# Patient Record
Sex: Female | Born: 1970 | Race: White | Hispanic: No | Marital: Married | Smoking: Former smoker
Health system: Southern US, Community
[De-identification: ages and names within clinical notes are randomized; demographics above are authoritative.]

## PROBLEM LIST (undated history)

## (undated) DIAGNOSIS — Z8709 Personal history of other diseases of the respiratory system: Secondary | ICD-10-CM

## (undated) DIAGNOSIS — R197 Diarrhea, unspecified: Secondary | ICD-10-CM

## (undated) DIAGNOSIS — Q796 Ehlers-Danlos syndrome, unspecified: Secondary | ICD-10-CM

## (undated) DIAGNOSIS — M2550 Pain in unspecified joint: Secondary | ICD-10-CM

## (undated) DIAGNOSIS — K649 Unspecified hemorrhoids: Secondary | ICD-10-CM

## (undated) DIAGNOSIS — Z8489 Family history of other specified conditions: Secondary | ICD-10-CM

## (undated) DIAGNOSIS — J302 Other seasonal allergic rhinitis: Secondary | ICD-10-CM

## (undated) DIAGNOSIS — Z9889 Other specified postprocedural states: Secondary | ICD-10-CM

## (undated) DIAGNOSIS — Z8669 Personal history of other diseases of the nervous system and sense organs: Secondary | ICD-10-CM

## (undated) DIAGNOSIS — R112 Nausea with vomiting, unspecified: Secondary | ICD-10-CM

## (undated) DIAGNOSIS — M1990 Unspecified osteoarthritis, unspecified site: Secondary | ICD-10-CM

## (undated) DIAGNOSIS — K5909 Other constipation: Secondary | ICD-10-CM

## (undated) DIAGNOSIS — J189 Pneumonia, unspecified organism: Secondary | ICD-10-CM

## (undated) DIAGNOSIS — K219 Gastro-esophageal reflux disease without esophagitis: Secondary | ICD-10-CM

## (undated) DIAGNOSIS — K429 Umbilical hernia without obstruction or gangrene: Secondary | ICD-10-CM

## (undated) HISTORY — PX: KNEE SURGERY: SHX244

## (undated) HISTORY — PX: TUBAL LIGATION: SHX77

---

## 2000-07-09 DIAGNOSIS — J189 Pneumonia, unspecified organism: Secondary | ICD-10-CM

## 2000-07-09 HISTORY — DX: Pneumonia, unspecified organism: J18.9

## 2008-04-05 ENCOUNTER — Emergency Department (HOSPITAL_COMMUNITY): Admission: EM | Admit: 2008-04-05 | Discharge: 2008-04-05 | Payer: Self-pay | Admitting: Family Medicine

## 2009-02-05 ENCOUNTER — Ambulatory Visit: Payer: Self-pay | Admitting: Diagnostic Radiology

## 2009-02-05 ENCOUNTER — Emergency Department (HOSPITAL_BASED_OUTPATIENT_CLINIC_OR_DEPARTMENT_OTHER): Admission: EM | Admit: 2009-02-05 | Discharge: 2009-02-05 | Payer: Self-pay | Admitting: Emergency Medicine

## 2009-03-10 ENCOUNTER — Encounter: Admission: RE | Admit: 2009-03-10 | Discharge: 2009-03-10 | Payer: Self-pay | Admitting: Orthopedic Surgery

## 2009-04-17 ENCOUNTER — Emergency Department (HOSPITAL_BASED_OUTPATIENT_CLINIC_OR_DEPARTMENT_OTHER): Admission: EM | Admit: 2009-04-17 | Discharge: 2009-04-17 | Payer: Self-pay | Admitting: Emergency Medicine

## 2009-04-17 ENCOUNTER — Ambulatory Visit: Payer: Self-pay | Admitting: Diagnostic Radiology

## 2011-04-09 LAB — POCT URINALYSIS DIP (DEVICE)
Hgb urine dipstick: NEGATIVE
Nitrite: NEGATIVE
Operator id: 235561
Specific Gravity, Urine: 1.01
pH: 7

## 2011-04-09 LAB — CBC
Hemoglobin: 14.7
RBC: 4.86
RDW: 12.9
WBC: 8.1

## 2011-04-09 LAB — POCT PREGNANCY, URINE: Preg Test, Ur: NEGATIVE

## 2011-04-09 LAB — DIFFERENTIAL
Basophils Absolute: 0.1
Lymphs Abs: 1.8
Monocytes Absolute: 0.6
Neutrophils Relative %: 68

## 2011-04-09 LAB — BASIC METABOLIC PANEL
BUN: 8
CO2: 29
Chloride: 104
GFR calc non Af Amer: >60
Glucose, Bld: 91

## 2011-04-09 LAB — WET PREP, GENITAL: Clue Cells Wet Prep HPF POC: NONE SEEN

## 2011-04-09 LAB — GC/CHLAMYDIA PROBE AMP, GENITAL: Chlamydia, DNA Probe: NEGATIVE

## 2011-07-05 ENCOUNTER — Emergency Department (HOSPITAL_COMMUNITY): Payer: Self-pay

## 2011-07-05 ENCOUNTER — Encounter: Payer: Self-pay | Admitting: *Deleted

## 2011-07-05 ENCOUNTER — Emergency Department (HOSPITAL_COMMUNITY)
Admission: EM | Admit: 2011-07-05 | Discharge: 2011-07-05 | Disposition: A | Discharge status: Home / Self Care | Payer: Self-pay | Attending: Emergency Medicine | Admitting: Emergency Medicine

## 2011-07-05 DIAGNOSIS — R0789 Other chest pain: Secondary | ICD-10-CM | POA: Insufficient documentation

## 2011-07-05 DIAGNOSIS — R05 Cough: Secondary | ICD-10-CM | POA: Insufficient documentation

## 2011-07-05 DIAGNOSIS — J40 Bronchitis, not specified as acute or chronic: Secondary | ICD-10-CM | POA: Insufficient documentation

## 2011-07-05 DIAGNOSIS — R059 Cough, unspecified: Secondary | ICD-10-CM | POA: Insufficient documentation

## 2011-07-05 MED ORDER — HYDROCODONE-ACETAMINOPHEN 5-500 MG PO TABS: 1.0000 | ORAL_TABLET | Freq: Four times a day (QID) | ORAL | Status: AC | PRN

## 2011-07-05 MED ORDER — IBUPROFEN 800 MG PO TABS
800.0000 mg | ORAL_TABLET | Freq: Once | ORAL | Status: AC
  Administered 2011-07-05: 800 mg via ORAL
  Filled 2011-07-05: qty 1

## 2011-07-05 MED ORDER — HYDROCODONE-ACETAMINOPHEN 5-325 MG PO TABS
2.0000 | ORAL_TABLET | Freq: Once | ORAL | Status: AC
  Administered 2011-07-05: 2 via ORAL
  Filled 2011-07-05: qty 2

## 2011-07-05 MED ORDER — AZITHROMYCIN 250 MG PO TABS: ORAL_TABLET | ORAL | Status: AC

## 2011-07-05 NOTE — ED Provider Notes (Signed)
History     CSN: [id number]  Arrival date & time 07/05/11  0908   First MD Initiated Contact with Patient 07/05/11 [phone]      No chief complaint on file.   (Consider location/radiation/quality/duration/timing/severity/associated sxs/prior treatment) The history is provided by the patient.  pt c/o prod cough x 1-2 weeks. Last pm coughing exceptionally hard and felt pop and increased pain to right lateral chest. Constant, sharp pain to area. Worse w movement, palpation, coughing. No sob. Also notes recent nasal congestion, scratchy throat. Says strep test at [facility]' negative. Non smoker. Sub fever. No chills/sweats. No trouble breathing or swallowing. No leg pain or swelling. No dvt or pe hx. No hx ptx.   History reviewed. No pertinent past medical history.  Past Surgical History  Procedure Date  . Cesarean section   . Knee arthroplasty 2nd surgerywas ACL replacement    History reviewed. No pertinent family history.  History  Substance Use Topics  . Smoking status: Passive Smoker  . Smokeless tobacco: Not on file  . Alcohol Use: Yes     socially    OB History    Grav Para Term Preterm Abortions TAB SAB Ect Mult Living                  Review of Systems  Constitutional: Negative for chills.  HENT: Negative for neck pain and neck stiffness.   Eyes: Negative for discharge and redness.  Respiratory: Negative for shortness of breath.   Cardiovascular: Negative for leg swelling.  Gastrointestinal: Negative for vomiting and diarrhea.  Genitourinary: Negative for flank pain.  Neurological: Negative for headaches.    Allergies  Penicillins  Home Medications   Current Outpatient Rx  Name Route Sig Dispense Refill  . GUAIFENESIN ER 600 MG PO TB12 Oral Take 600 mg by mouth 2 (two) times daily.      Marland Kitchen HYDROCODONE-HOMATROPINE 5-1.5 MG/5ML PO SYRP Oral Take 5 mLs by mouth every 6 (six) hours as needed. For cough     . IBUPROFEN 200 MG PO TABS Oral Take 800 mg by mouth  every 6 (six) hours as needed. For pain     . PSEUDOEPHEDRINE HCL 120 MG PO TB12 Oral Take 120 mg by mouth every 12 (twelve) hours.        BP 120/74  Pulse 71  Temp(Src) 98.5 F (36.9 C) (Oral)  Resp 20  SpO2 100%  LMP 06/23/2011  Physical Exam  Nursing note and vitals reviewed. Constitutional: She is oriented to person, place, and time. She appears well-developed and well-nourished. No distress.  HENT:  Nose: Nose normal.  Mouth/Throat: Oropharynx is clear and moist.  Eyes: Conjunctivae are normal. No scleral icterus.  Neck: Neck supple. No tracheal deviation present.  Cardiovascular: Normal rate, regular rhythm, normal heart sounds and intact distal pulses.  Exam reveals no gallop and no friction rub.   No murmur heard. Pulmonary/Chest: Effort normal and breath sounds normal. No respiratory distress. She exhibits tenderness.       Marked right chest tenderness reproducing symptoms  Abdominal: Soft. Normal appearance and bowel sounds are normal. She exhibits no distension and no mass. There is no tenderness. There is no rebound and no guarding.  Musculoskeletal: Normal range of motion. She exhibits no edema and no tenderness.       Spine nt  Lymphadenopathy:    She has no cervical adenopathy.  Neurological: She is alert and oriented to person, place, and time.  Skin: Skin is warm and dry.  No rash noted.  Psychiatric: She has a normal mood and affect.    ED Course  Procedures (including critical care time)  Dg Chest 2 View  07/05/2011  *RADIOLOGY REPORT*  Clinical Data: Chest pain, rule out rib fracture from coughing, rule out pneumonia  CHEST - 2 VIEW  Comparison: None.  Findings: Heart size and vascular pattern are normal.  No infiltrate or effusion.  No pneumothorax.  No rib lesions.  IMPRESSION: Normal study.  Original Report Authenticated By: [id number]   Dg Ribs Unilateral Right  07/05/2011  *RADIOLOGY REPORT*  Clinical Data: Right rib pain, chest pain  RIGHT RIBS - 2  VIEW  Comparison: 07/05/2011  Findings: Two views right ribs submitted.  No right rib fracture is identified.  No diagnostic pneumothorax.  IMPRESSION: No right rib fracture is identified.  No diagnostic pneumothorax.  Original Report Authenticated By: Natasha Mead, M.D.       MDM  Cxr. Pt has ride, didn't drive. No meds pta. vicodin po. Motrin po.   As worsening prod cough x 2weeks, will rx bronchitis.         Suzi Roots, MD 07/05/11 1020

## 2011-07-05 NOTE — ED Notes (Signed)
Patient states she has had a really bad cold and went to the doctor on Friday. Patient states that she was getting out of the shower last night and started coughing and felt something pop in her right side and the pain started then. Patient states she slept in a recliner with a heating pad and pillows last night thinking it would be better this morning but the pain is worse and the congestion and cough is worse.

## 2011-12-14 ENCOUNTER — Emergency Department (HOSPITAL_COMMUNITY)
Admission: EM | Admit: 2011-12-14 | Discharge: 2011-12-14 | Disposition: A | Discharge status: Home / Self Care | Payer: Self-pay | Attending: Emergency Medicine | Admitting: Emergency Medicine

## 2011-12-14 ENCOUNTER — Encounter (HOSPITAL_COMMUNITY): Payer: Self-pay | Admitting: *Deleted

## 2011-12-14 ENCOUNTER — Emergency Department (HOSPITAL_COMMUNITY): Payer: Self-pay

## 2011-12-14 DIAGNOSIS — K429 Umbilical hernia without obstruction or gangrene: Secondary | ICD-10-CM | POA: Insufficient documentation

## 2011-12-14 HISTORY — DX: Umbilical hernia without obstruction or gangrene: K42.9

## 2011-12-14 LAB — BASIC METABOLIC PANEL
BUN: 11 mg/dL (ref 6–23)
Calcium: 9.5 mg/dL (ref 8.4–10.5)
GFR calc Af Amer: >90 mL/min (ref >90)

## 2011-12-14 LAB — CBC
HCT: 44.5 % (ref 36.0–46.0)
MCH: 31.3 pg (ref 26.0–34.0)
MCHC: 34.2 g/dL (ref 30.0–36.0)
RBC: 4.86 MIL/uL (ref 3.87–5.11)
RDW: 12.8 % (ref 11.5–15.5)

## 2011-12-14 MED ORDER — ONDANSETRON HCL 4 MG/2ML IJ SOLN
4.0000 mg | Freq: Once | INTRAMUSCULAR | Status: AC
  Administered 2011-12-14: 4 mg via INTRAVENOUS
  Filled 2011-12-14: qty 2

## 2011-12-14 MED ORDER — HYDROMORPHONE HCL PF 1 MG/ML IJ SOLN
1.0000 mg | Freq: Once | INTRAMUSCULAR | Status: AC
  Administered 2011-12-14: 1 mg via INTRAVENOUS
  Filled 2011-12-14: qty 1

## 2011-12-14 MED ORDER — ONDANSETRON HCL 4 MG PO TABS: 4.0000 mg | ORAL_TABLET | Freq: Four times a day (QID) | ORAL | Status: AC

## 2011-12-14 MED ORDER — OXYCODONE-ACETAMINOPHEN 5-325 MG PO TABS: 1.0000 | ORAL_TABLET | Freq: Four times a day (QID) | ORAL | Status: AC | PRN

## 2011-12-14 NOTE — Discharge Instructions (Signed)
Hernia  A hernia occurs when an internal organ pushes out through a weak spot in the abdominal wall. Hernias most commonly occur in the groin and around the navel. Hernias often can be pushed back into place (reduced). Most hernias tend to get worse over time. Some abdominal hernias can get stuck in the opening (irreducible or incarcerated hernia) and cannot be reduced. An irreducible abdominal hernia which is tightly squeezed into the opening is at risk for impaired blood supply (strangulated hernia). A strangulated hernia is a medical emergency. Because of the risk for an irreducible or strangulated hernia, surgery may be recommended to repair a hernia.  CAUSES     Heavy lifting.    Prolonged coughing.    Straining to have a bowel movement.    A cut (incision) made during an abdominal surgery.   HOME CARE INSTRUCTIONS     Bed rest is not required. You may continue your normal activities.    Avoid lifting more than 10 pounds (4.5 kg) or straining.    Cough gently. If you are a smoker it is best to stop. Even the best hernia repair can break down with the continual strain of coughing. Even if you do not have your hernia repaired, a cough will continue to aggravate the problem.    Do not wear anything tight over your hernia. Do not try to keep it in with an outside bandage or truss. These can damage abdominal contents if they are trapped within the hernia sac.    Eat a normal diet.    Avoid constipation. Straining over long periods of time will increase hernia size and encourage breakdown of repairs. If you cannot do this with diet alone, stool softeners may be used.   SEEK IMMEDIATE MEDICAL CARE IF:     You have a fever.    You develop increasing abdominal pain.    You feel nauseous or vomit.    Your hernia is stuck outside the abdomen, looks discolored, feels hard, or is tender.    You have any changes in your bowel habits or in the hernia that are unusual for you.     You have increased pain or swelling around the hernia.    You cannot push the hernia back in place by applying gentle pressure while lying down.   MAKE SURE YOU:     Understand these instructions.    Will watch your condition.    Will get help right away if you are not doing well or get worse.   Document Released: 06/25/2005 Document Revised: 06/14/2011 Document Reviewed: 02/12/2008  [employer] Patient Information 2012 [employer]    Hernia, Surgical Repair  A hernia occurs when an internal organ pushes out through a weak spot in the belly (abdominal) wall muscles. Hernias commonly occur in the groin and around the navel. Hernias often can be pushed back into place (reduced). Most hernias tend to get worse over time. Problems occur when abdominal contents get stuck in the opening (incarcerated hernia). The blood supply gets cut off (strangulated hernia). This is an emergency and needs surgery. Otherwise, hernia repair can be an elective procedure. This means you can schedule this at your convenience when an emergency is not present. Because complications can occur, if you decide to repair the hernia, it is best to do it soon. When it becomes an emergency procedure, there is increased risk of complications after surgery.  CAUSES     Heavy lifting.    Obesity.      Prolonged coughing.    Straining to move your bowels.    Hernias can also occur through a cut (incision) by a surgeonafter an abdominal operation.   HOME CARE INSTRUCTIONS  Before the repair:   Bed rest is not required. You may continue your normal activities, but avoid heavy lifting (more than 10 pounds) or straining. Cough gently. If you are a smoker, it is best to stop. Even the best hernia repair can break down with the continual strain of coughing.    Do not wear anything tight over your hernia. Do not try to keep it in with an outside bandage or truss. These can damage abdominal contents if they are trapped in the hernia sac.     Eat a normal diet. Avoid constipation. Straining over long periods of time to have a bowel movement will increase hernia size. It also can breakdown repairs. If you cannot do this with diet alone, laxatives or stool softeners may be used.   PRIOR TO SURGERY, SEEK IMMEDIATE MEDICAL CARE IF:  You have problems (symptoms) of a trapped (incarcerated) hernia. Symptoms include:   An oral temperature above 102 F (38.9 C) develops, or as your caregiver suggests.    Increasing abdominal pain.    Feeling sick to your stomach(nausea) and vomiting.    You stop passing gas or stool.    The hernia is stuck outside the abdomen, looks discolored, feels hard, or is tender.    You have any changes in your bowel habits or in the hernia that is unusual for you.   LET YOUR CAREGIVERS KNOW ABOUT THE FOLLOWING:   Allergies.    Medications taken including herbs, eye drops, over the counter medications, and creams.    Use of steroids (by mouth or creams).    Family or personal history of problems with anesthetics or Novocaine.    Possibility of pregnancy, if this applies.    Personal history of blood clots (thrombophlebitis).    Family or personal history of bleeding or blood problems.    Previous surgery.    Other health problems.   BEFORE THE PROCEDURE  You should be present 1 hour prior to your procedure, or as directed by your caregiver.    AFTER THE PROCEDURE  After surgery, you will be taken to the recovery area. A nurse will watch and check your progress there. Once you are awake, stable, and taking fluids well, you will be allowed to go home as long as there are no problems. Once home, an ice pack (wrapped in a light towel) applied to your operative site may help with discomfort. It may also keep the swelling down. Do not lift anything heavier than 10 pounds (4.55 kilograms). Take showers not baths. Do not drive while taking narcotics. Follow instructions as suggested by your caregiver.     SEEK IMMEDIATE MEDICAL CARE IF:  After surgery:   There is redness, swelling, or increasing pain in the wound.    There is pus coming from the wound.    There is drainage from a wound lasting longer than 1 day.    An unexplained oral temperature above 102 F (38.9 C) develops.    You notice a foul smell coming from the wound or dressing.    There is a breaking open of a wound (edged not staying together) after the sutures have been removed.    You notice increasing pain in the shoulders (shoulder strap areas).    You develop dizzy episodes or fainting

## 2011-12-14 NOTE — ED Notes (Signed)
Patient states that she has an abdominal hernia that has been present for four years. States that it occurred after the birth of her fourth child.  Yesterday she began having extreme pain that caused her to double over.  States that the pain worsens when she eats and that she is belching more than usual. Area is tender to palpation. Bowel sounds present.

## 2011-12-14 NOTE — ED Notes (Signed)
Patient has hx of umbilical hernia,  She was lifting boxes last night and felt pain in her abd.  Patient states the pain increased last night.  Patient states she has pain that radiates into her back.   Patient states she feels like she is swollen in her abd and feels pressure like gas in her stomach which is unusual.  Patient has had intermittent nausea as well

## 2011-12-14 NOTE — ED Provider Notes (Signed)
History     CSN: [id number]  Arrival date & time 12/14/11  1014   First MD Initiated Contact with Patient 12/14/11 1214      Chief Complaint  Patient presents with  . Abdominal Pain    (Consider location/radiation/quality/duration/timing/severity/associated sxs/prior treatment) HPI  Patient presents to the ED with complaints of her hernia hurting. She states that she has a known umbilical hernia that became very painful last night. She states that she was doubled over in pain and pacing around. Then she admits to having one episode of vomiting this morning. After having vomiting her husband told her she should come to the ER because she is aware of the possibility of bowel obstruction or strangulated hernia. Therefore she came for pain management and to make sure "everything is okay". 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 Pt in NAD and VSS  Past Medical History  Diagnosis Date  . Umbilical hernia     Past Surgical History  Procedure Date  . Cesarean section   . Knee arthroplasty 2nd surgerywas ACL replacement    No family history on file.  History  Substance Use Topics  . Smoking status: Passive Smoker  . Smokeless tobacco: Not on file  . Alcohol Use: Yes     socially    OB History    Grav Para Term Preterm Abortions TAB SAB Ect Mult Living                  Review of Systems   HEENT: denies blurry vision or change in hearing PULMONARY: Denies difficulty breathing and SOB CARDIAC: denies chest pain or heart palpitations MUSCULOSKELETAL:  denies being unable to ambulate ABDOMEN AL: denies diarrhea GU: denies loss of bowel or urinary control NEURO: denies numbness and tingling in extremities SKIN: no new rashes PSYCH: patient behavior is normal NECK: Not complaining of neck pain   Allergies  Penicillins  Home Medications   Current Outpatient Rx  Name Route Sig  Dispense Refill  . GLUCOSAMINE CHONDR COMPLEX PO Oral Take 1 tablet by mouth daily.    . IBUPROFEN 200 MG PO TABS Oral Take 200 mg by mouth every 6 (six) hours as needed. For pain.    . ADULT MULTIVITAMIN W/MINERALS CH Oral Take 1 tablet by mouth daily.    Marland Kitchen ONDANSETRON HCL 4 MG PO TABS Oral Take 1 tablet (4 mg total) by mouth every 6 (six) hours. 12 tablet 0  . OXYCODONE-ACETAMINOPHEN 5-325 MG PO TABS Oral Take 1 tablet by mouth every 6 (six) hours as needed for pain. 15 tablet 0    BP 120/73  Pulse 58  Temp(Src) 98.9 F (37.2 C) (Oral)  Resp 16  Ht 5\' 5"  (1.651 m)  Wt 197 lb (89.359 kg)  BMI 32.78 kg/m2  SpO2 98%  LMP 11/30/2011  Physical Exam  Constitutional: She appears well-developed and well-nourished. No distress.  HENT:  Head: Normocephalic and atraumatic.  Mouth/Throat: Uvula is midline, oropharynx is clear and moist and mucous membranes are normal.  Eyes: Pupils are equal, round, and reactive to light.  Neck: Trachea normal, normal range of motion and full passive range of motion without pain.  Cardiovascular: Normal rate, regular rhythm, normal heart sounds and normal pulses.   Pulmonary/Chest: Effort normal and breath sounds normal. Chest wall is not dull to percussion. She exhibits no crepitus, no edema, no deformity and no retraction.  Abdominal: Normal appearance. She exhibits no distension. There is tenderness. There is no guarding.       Umbilical hernia noted. With firm palpation I was able to reduce the hernia without it popping back through again.  Musculoskeletal: Normal range of motion.  Neurological: She is alert. She has normal strength.  Skin: Skin is warm, dry and intact. She is not diaphoretic.  Psychiatric: She has a normal mood and affect. Her speech is normal. Cognition and memory are normal.    ED Course  Procedures (including critical care time)  Labs Reviewed  CBC - Abnormal; Notable for the following:    WBC 12.7 (*)    Hemoglobin 15.2 (*)     All other components within normal limits  BASIC METABOLIC PANEL   Dg Abd Acute W/chest  12/14/2011  *RADIOLOGY REPORT*  Clinical Data: Mid abdominal pain with nausea.  Question small bowel obstruction?  ACUTE ABDOMEN SERIES (ABDOMEN 2 VIEW & CHEST 1 VIEW)  Comparison: 07/05/2011 chest x-ray.  Findings: Clear lungs.  Heart size within normal limits.  No plain film evidence of bowel obstruction.  No free intraperitoneal air.  IMPRESSION: No evidence of bowel obstruction or free intraperitoneal air.  Original Report Authenticated By: Fuller Canada, M.D.     1. Umbilical hernia       MDM  Patient initially had severe pain with hernia reduction, after about 10 minutes she had significant relief. Her plan films show no concerns for perforation or obstruction. She is feeling much better. She has been PO challenged without any complications.  I have thoroughly discussed reasons to return to the ED. Strangulated hernia being my biggest concern. She voices her understanding.  I have given her a referral to [facility] for elective surgery.  Pt has been advised of the symptoms that warrant their return to the ED. Patient has voiced understanding and has agreed to follow-up with the PCP or specialist.         Dorthula Matas, PA 12/14/11 [phone]

## 2011-12-14 NOTE — ED Provider Notes (Signed)
Medical screening examination/treatment/procedure(s) were performed by non-physician practitioner and as supervising physician I was immediately available for consultation/collaboration.   Dione Booze, MD 12/14/11 [phone]

## 2012-05-14 ENCOUNTER — Institutional Professional Consult (permissible substitution): Payer: Self-pay | Admitting: Internal Medicine

## 2013-10-04 ENCOUNTER — Emergency Department (HOSPITAL_COMMUNITY)
Admission: EM | Admit: 2013-10-04 | Discharge: 2013-10-04 | Disposition: A | Discharge status: Home / Self Care | Payer: BC Managed Care – PPO | Attending: Emergency Medicine | Admitting: Emergency Medicine

## 2013-10-04 ENCOUNTER — Emergency Department (HOSPITAL_COMMUNITY): Payer: BC Managed Care – PPO

## 2013-10-04 ENCOUNTER — Encounter (HOSPITAL_COMMUNITY): Payer: Self-pay | Admitting: Emergency Medicine

## 2013-10-04 DIAGNOSIS — Z79899 Other long term (current) drug therapy: Secondary | ICD-10-CM | POA: Insufficient documentation

## 2013-10-04 DIAGNOSIS — K429 Umbilical hernia without obstruction or gangrene: Secondary | ICD-10-CM | POA: Insufficient documentation

## 2013-10-04 DIAGNOSIS — R1033 Periumbilical pain: Secondary | ICD-10-CM | POA: Insufficient documentation

## 2013-10-04 DIAGNOSIS — Z880 Allergy status to penicillin: Secondary | ICD-10-CM | POA: Insufficient documentation

## 2013-10-04 DIAGNOSIS — Y939 Activity, unspecified: Secondary | ICD-10-CM | POA: Insufficient documentation

## 2013-10-04 DIAGNOSIS — Y929 Unspecified place or not applicable: Secondary | ICD-10-CM | POA: Insufficient documentation

## 2013-10-04 DIAGNOSIS — T59811A Toxic effect of smoke, accidental (unintentional), initial encounter: Secondary | ICD-10-CM | POA: Insufficient documentation

## 2013-10-04 LAB — COMPREHENSIVE METABOLIC PANEL
ALBUMIN: 3.6 g/dL (ref 3.5–5.2)
ALT: 17 U/L (ref 0–35)
AST: 15 U/L (ref 0–37)
Alkaline Phosphatase: 68 U/L (ref 39–117)
BUN: 8 mg/dL (ref 6–23)
CALCIUM: 8.6 mg/dL (ref 8.4–10.5)
CO2: 26 mEq/L (ref 19–32)
Chloride: 102 mEq/L (ref 96–112)
Creatinine, Ser: 0.69 mg/dL (ref 0.50–1.10)
GFR calc Af Amer: >90 mL/min (ref >90)
GFR calc non Af Amer: >90 mL/min (ref >90)
Glucose, Bld: 97 mg/dL (ref 70–99)
Potassium: 4.1 mEq/L (ref 3.7–5.3)
SODIUM: 140 meq/L (ref 137–147)
TOTAL PROTEIN: 6.2 g/dL (ref 6.0–8.3)
Total Bilirubin: 0.4 mg/dL (ref 0.3–1.2)

## 2013-10-04 LAB — CBC WITH DIFFERENTIAL/PLATELET
BASOS ABS: 0 10*3/uL (ref 0.0–0.1)
Basophils Relative: 0 % (ref 0–1)
EOS ABS: 0 10*3/uL (ref 0.0–0.7)
EOS PCT: 0 % (ref 0–5)
HCT: 43.9 % (ref 36.0–46.0)
Hemoglobin: 15 g/dL (ref 12.0–15.0)
LYMPHS PCT: 10 % — AB (ref 12–46)
Lymphs Abs: 1.2 10*3/uL (ref 0.7–4.0)
MCH: 32 pg (ref 26.0–34.0)
MCHC: 34.2 g/dL (ref 30.0–36.0)
MCV: 93.6 fL (ref 78.0–100.0)
Monocytes Absolute: 0.7 10*3/uL (ref 0.1–1.0)
Monocytes Relative: 5 % (ref 3–12)
NEUTROS PCT: 85 % — AB (ref 43–77)
Neutro Abs: 11.1 10*3/uL — ABNORMAL HIGH (ref 1.7–7.7)
PLATELETS: 271 10*3/uL (ref 150–400)
RBC: 4.69 MIL/uL (ref 3.87–5.11)
RDW: 12.8 % (ref 11.5–15.5)
WBC: 13 10*3/uL — AB (ref 4.0–10.5)

## 2013-10-04 LAB — LIPASE, BLOOD: Lipase: 23 U/L (ref 11–59)

## 2013-10-04 MED ORDER — HYDROCODONE-ACETAMINOPHEN 5-325 MG PO TABS: 1.0000 | ORAL_TABLET | Freq: Four times a day (QID) | ORAL | Status: DC | PRN

## 2013-10-04 MED ORDER — PROMETHAZINE HCL 25 MG PO TABS: 25.0000 mg | ORAL_TABLET | Freq: Four times a day (QID) | ORAL | Status: DC | PRN

## 2013-10-04 MED ORDER — HYDROMORPHONE HCL PF 1 MG/ML IJ SOLN
0.5000 mg | Freq: Once | INTRAMUSCULAR | Status: AC
  Administered 2013-10-04: 0.5 mg via INTRAVENOUS
  Filled 2013-10-04: qty 1

## 2013-10-04 MED ORDER — SODIUM CHLORIDE 0.9 % IV BOLUS (SEPSIS)
1000.0000 mL | Freq: Once | INTRAVENOUS | Status: AC
  Administered 2013-10-04: 1000 mL via INTRAVENOUS

## 2013-10-04 MED ORDER — HYDROMORPHONE HCL PF 1 MG/ML IJ SOLN
0.5000 mg | Freq: Once | INTRAMUSCULAR | Status: AC
  Administered 2013-10-04: 0.5 mg via INTRAVENOUS

## 2013-10-04 MED ORDER — HYDROMORPHONE HCL PF 1 MG/ML IJ SOLN
1.0000 mg | Freq: Once | INTRAMUSCULAR | Status: DC
Start: 2013-10-04 — End: 2013-10-04

## 2013-10-04 MED ORDER — ONDANSETRON HCL 4 MG/2ML IJ SOLN
4.0000 mg | Freq: Once | INTRAMUSCULAR | Status: AC
  Administered 2013-10-04: 4 mg via INTRAVENOUS
  Filled 2013-10-04: qty 2

## 2013-10-04 NOTE — ED Notes (Signed)
Patient arrived to room from xray.

## 2013-10-04 NOTE — ED Provider Notes (Signed)
CSN: [id number]     Arrival date & time 10/04/13  1349 History   First MD Initiated Contact with Patient 10/04/13 1402     Chief Complaint  Patient presents with  . Emesis     (Consider location/radiation/quality/duration/timing/severity/associated sxs/prior Treatment) HPI Courtney Clements is a 43 y.o. female presents emergency department complaining of abdominal pain. Patient has a known umbilical hernia, and states that her pain started acutely last night. She reports pain is in the periumbilical area. She's unsure of the hernia is out states that sometimes it does pop out but she's able to push it in. She states she tried to massage her abdomen at home but unable because of the pain. She reports multiple notes of vomiting since then. Her last bowel movement was 2 days ago, she is unsure if she is passing gas. She denies any fever or chills. No back pain. No urinary symptoms. No other associated symptoms..   Past Medical History  Diagnosis Date  . Umbilical hernia    Past Surgical History  Procedure Laterality Date  . Cesarean section    . Knee arthroplasty  2nd surgerywas ACL replacement   History reviewed. No pertinent family history. History  Substance Use Topics  . Smoking status: Passive Smoke Exposure - Never Smoker  . Smokeless tobacco: Not on file  . Alcohol Use: Yes     Comment: socially   OB History   Grav Para Term Preterm Abortions TAB SAB Ect Mult Living                 Review of Systems  Constitutional: Negative for fever and chills.  Respiratory: Negative for cough, chest tightness and shortness of breath.   Cardiovascular: Negative for chest pain, palpitations and leg swelling.  Gastrointestinal: Positive for nausea, vomiting and abdominal pain. Negative for diarrhea and abdominal distention.  Genitourinary: Negative for dysuria, flank pain and pelvic pain.  Musculoskeletal: Negative for arthralgias, myalgias, neck pain and neck stiffness.  Skin: Negative for  rash.  Neurological: Negative for dizziness, weakness and headaches.  All other systems reviewed and are negative.      Allergies  Penicillins  Home Medications   Current Outpatient Rx  Name  Route  Sig  Dispense  Refill  . Glucosamine-Chondroitin (GLUCOSAMINE CHONDR COMPLEX PO)   Oral   Take 1 tablet by mouth daily.         Marland Kitchen. ibuprofen (ADVIL,MOTRIN) 200 MG tablet   Oral   Take 200 mg by mouth every 6 (six) hours as needed. For pain.         . Multiple Vitamin (MULTIVITAMIN WITH MINERALS) TABS   Oral   Take 1 tablet by mouth daily.          BP 140/75  Pulse 93  Temp(Src) 98.5 F (36.9 C) (Oral)  Resp 20  Wt 195 lb (88.451 kg)  SpO2 100%  LMP 09/30/2013 Physical Exam  Nursing note and vitals reviewed. Constitutional: She appears well-developed and well-nourished. No distress.  HENT:  Head: Normocephalic.  Eyes: Conjunctivae are normal.  Neck: Neck supple.  Cardiovascular: Normal rate, regular rhythm and normal heart sounds.   Pulmonary/Chest: Effort normal and breath sounds normal. No respiratory distress. She has no wheezes. She has no rales.  Abdominal: Soft. Bowel sounds are normal. She exhibits no distension. There is tenderness. There is no rebound.  periumbilical tenderness. Small incarcerated umbilical hernia palpated. No surrounding erythema, no obvious swelling  Musculoskeletal: She exhibits no edema.  Neurological: She  is alert.  Skin: Skin is warm and dry.  Psychiatric: She has a normal mood and affect. Her behavior is normal.    ED Course  Procedures (including critical care time) Labs Review Labs Reviewed  CBC WITH DIFFERENTIAL - Abnormal; Notable for the following:    WBC 13.0 (*)    Neutrophils Relative % 85 (*)    Neutro Abs 11.1 (*)    Lymphocytes Relative 10 (*)    All other components within normal limits  COMPREHENSIVE METABOLIC PANEL  LIPASE, BLOOD   Imaging Review Dg Abd 2 Views  10/04/2013   CLINICAL DATA:  Vomiting.   Constipation.  EXAM: ABDOMEN - 2 VIEW  COMPARISON:  12/14/2011  FINDINGS: Normal bowel gas pattern. No free air. Soft tissues are unremarkable. No significant bony abnormality.  IMPRESSION: Negative.   Electronically Signed   By: Amie Portland M.D.   On: 10/04/2013 14:33     EKG Interpretation None      MDM   Final diagnoses:  Hernia, umbilical    Pt with umbilical hernia, unable to reduce at home.   Will try pain medications and reducing it.    5:12 PM I was unable to reduce it. It was reduced by Dr. Fayrene Fearing. Pt is feeling better, still some nausea reported. Will ambulate in hallway.   5:43 PM pt feeling much better. Ambulated in the hallway. Will give referral to general surgery. Pain meds and antiemetics to go home with.   Filed Vitals:   10/04/13 1515 10/04/13 1614 10/04/13 1630 10/04/13 1636  BP: 118/62 107/73 112/62 122/68  Pulse: 58 60 60 58  Temp:  98.1 F (36.7 C)    TempSrc:  Oral    Resp:  18    Weight:      SpO2: 98% 99% 100% 100%      Lottie Mussel, PA-C 10/04/13 1744

## 2013-10-04 NOTE — ED Notes (Signed)
Pt in c/o abd pain area hernia and vomiting after eating or drinking, states this started last night

## 2013-10-04 NOTE — ED Provider Notes (Signed)
Medical screening examination/treatment/procedure(s) were conducted as a shared visit with non-physician practitioner(s) and myself.  I personally evaluated the patient during the encounter.   EKG Interpretation None      Patient seen examined. History reviewed. Procedure history reduction. After some appropriate pain medication. She was laid supine in slight Trendelenburg. I was able to simply reduce her periumbilical hernia. Plan to discharge home.  Rolland PorterMark Cesar Rogerson, MD 10/04/13 2024

## 2013-10-04 NOTE — Discharge Instructions (Signed)
Take pain and nausea medications as prescribed. If it becomes stuck again, apply ice, and try to reduce it with firm pressure. If unable, return to ER. You can try wearing an abdominal binder for more support.    Hernia A hernia occurs when an internal organ pushes out through a weak spot in the abdominal wall. Hernias most commonly occur in the groin and around the navel. Hernias often can be pushed back into place (reduced). Most hernias tend to get worse over time. Some abdominal hernias can get stuck in the opening (irreducible or incarcerated hernia) and cannot be reduced. An irreducible abdominal hernia which is tightly squeezed into the opening is at risk for impaired blood supply (strangulated hernia). A strangulated hernia is a medical emergency. Because of the risk for an irreducible or strangulated hernia, surgery may be recommended to repair a hernia. CAUSES   Heavy lifting.  Prolonged coughing.  Straining to have a bowel movement.  A cut (incision) made during an abdominal surgery. HOME CARE INSTRUCTIONS   Bed rest is not required. You may continue your normal activities.  Avoid lifting more than 10 pounds (4.5 kg) or straining.  Cough gently. If you are a smoker it is best to stop. Even the best hernia repair can break down with the continual strain of coughing. Even if you do not have your hernia repaired, a cough will continue to aggravate the problem.  Do not wear anything tight over your hernia. Do not try to keep it in with an outside bandage or truss. These can damage abdominal contents if they are trapped within the hernia sac.  Eat a normal diet.  Avoid constipation. Straining over long periods of time will increase hernia size and encourage breakdown of repairs. If you cannot do this with diet alone, stool softeners may be used. SEEK IMMEDIATE MEDICAL CARE IF:   You have a fever.  You develop increasing abdominal pain.  You feel nauseous or vomit.  Your  hernia is stuck outside the abdomen, looks discolored, feels hard, or is tender.  You have any changes in your bowel habits or in the hernia that are unusual for you.  You have increased pain or swelling around the hernia.  You cannot push the hernia back in place by applying gentle pressure while lying down. MAKE SURE YOU:   Understand these instructions.  Will watch your condition.  Will get help right away if you are not doing well or get worse. Document Released: 06/25/2005 Document Revised: 09/17/2011 Document Reviewed: 02/12/2008 [facility] Patient Information 2014 [employer], [place].

## 2013-11-16 ENCOUNTER — Encounter (HOSPITAL_COMMUNITY): Payer: Self-pay | Admitting: Emergency Medicine

## 2013-11-16 ENCOUNTER — Telehealth (INDEPENDENT_AMBULATORY_CARE_PROVIDER_SITE_OTHER): Payer: Self-pay | Admitting: General Surgery

## 2013-11-16 ENCOUNTER — Emergency Department (HOSPITAL_COMMUNITY)
Admission: EM | Admit: 2013-11-16 | Discharge: 2013-11-17 | Disposition: A | Discharge status: Home / Self Care | Payer: BC Managed Care – PPO | Attending: Emergency Medicine | Admitting: Emergency Medicine

## 2013-11-16 ENCOUNTER — Emergency Department (HOSPITAL_COMMUNITY): Payer: BC Managed Care – PPO

## 2013-11-16 DIAGNOSIS — K439 Ventral hernia without obstruction or gangrene: Secondary | ICD-10-CM | POA: Insufficient documentation

## 2013-11-16 DIAGNOSIS — Z3202 Encounter for pregnancy test, result negative: Secondary | ICD-10-CM | POA: Insufficient documentation

## 2013-11-16 DIAGNOSIS — Z91040 Latex allergy status: Secondary | ICD-10-CM | POA: Insufficient documentation

## 2013-11-16 DIAGNOSIS — R1084 Generalized abdominal pain: Secondary | ICD-10-CM

## 2013-11-16 DIAGNOSIS — K429 Umbilical hernia without obstruction or gangrene: Secondary | ICD-10-CM | POA: Insufficient documentation

## 2013-11-16 DIAGNOSIS — Z880 Allergy status to penicillin: Secondary | ICD-10-CM | POA: Insufficient documentation

## 2013-11-16 DIAGNOSIS — Z79899 Other long term (current) drug therapy: Secondary | ICD-10-CM | POA: Insufficient documentation

## 2013-11-16 LAB — CBC WITH DIFFERENTIAL/PLATELET
BASOS ABS: 0 10*3/uL (ref 0.0–0.1)
Basophils Relative: 0 % (ref 0–1)
EOS PCT: 2 % (ref 0–5)
Eosinophils Absolute: 0.2 10*3/uL (ref 0.0–0.7)
HCT: 45.3 % (ref 36.0–46.0)
Hemoglobin: 15.2 g/dL — ABNORMAL HIGH (ref 12.0–15.0)
LYMPHS ABS: 1.9 10*3/uL (ref 0.7–4.0)
LYMPHS PCT: 19 % (ref 12–46)
MCH: 31.3 pg (ref 26.0–34.0)
MCHC: 33.6 g/dL (ref 30.0–36.0)
MCV: 93.4 fL (ref 78.0–100.0)
Monocytes Absolute: 0.7 10*3/uL (ref 0.1–1.0)
Monocytes Relative: 6 % (ref 3–12)
NEUTROS ABS: 7.6 10*3/uL (ref 1.7–7.7)
Neutrophils Relative %: 73 % (ref 43–77)
PLATELETS: 242 10*3/uL (ref 150–400)
RBC: 4.85 MIL/uL (ref 3.87–5.11)
RDW: 12.7 % (ref 11.5–15.5)
WBC: 10.4 10*3/uL (ref 4.0–10.5)

## 2013-11-16 LAB — URINALYSIS, ROUTINE W REFLEX MICROSCOPIC
BILIRUBIN URINE: NEGATIVE
Glucose, UA: NEGATIVE mg/dL
KETONES UR: NEGATIVE mg/dL
Leukocytes, UA: NEGATIVE
NITRITE: NEGATIVE
PROTEIN: NEGATIVE mg/dL
SPECIFIC GRAVITY, URINE: 1.01 (ref 1.005–1.030)
Urobilinogen, UA: 0.2 mg/dL (ref 0.0–1.0)
pH: 6 (ref 5.0–8.0)

## 2013-11-16 LAB — COMPREHENSIVE METABOLIC PANEL
ALT: 19 U/L (ref 0–35)
AST: 15 U/L (ref 0–37)
Albumin: 4 g/dL (ref 3.5–5.2)
Alkaline Phosphatase: 74 U/L (ref 39–117)
BILIRUBIN TOTAL: 0.3 mg/dL (ref 0.3–1.2)
BUN: 8 mg/dL (ref 6–23)
CO2: 26 mEq/L (ref 19–32)
CREATININE: 0.75 mg/dL (ref 0.50–1.10)
Calcium: 9 mg/dL (ref 8.4–10.5)
Chloride: 106 mEq/L (ref 96–112)
GFR calc non Af Amer: >90 mL/min (ref >90)
GLUCOSE: 90 mg/dL (ref 70–99)
Potassium: 3.7 mEq/L (ref 3.7–5.3)
Sodium: 142 mEq/L (ref 137–147)
Total Protein: 7 g/dL (ref 6.0–8.3)

## 2013-11-16 LAB — URINE MICROSCOPIC-ADD ON

## 2013-11-16 LAB — PREGNANCY, URINE: Preg Test, Ur: NEGATIVE

## 2013-11-16 MED ORDER — IOHEXOL 300 MG/ML  SOLN
100.0000 mL | Freq: Once | INTRAMUSCULAR | Status: AC | PRN
  Administered 2013-11-16: 100 mL via INTRAVENOUS

## 2013-11-16 MED ORDER — ONDANSETRON HCL 4 MG/2ML IJ SOLN
4.0000 mg | Freq: Once | INTRAMUSCULAR | Status: AC
  Administered 2013-11-16: 4 mg via INTRAVENOUS
  Filled 2013-11-16: qty 2

## 2013-11-16 MED ORDER — MORPHINE SULFATE 4 MG/ML IJ SOLN
6.0000 mg | Freq: Once | INTRAMUSCULAR | Status: AC
  Administered 2013-11-16: 6 mg via INTRAVENOUS
  Filled 2013-11-16: qty 2

## 2013-11-16 MED ORDER — ONDANSETRON 4 MG PO TBDP: ORAL_TABLET | ORAL | Status: DC

## 2013-11-16 MED ORDER — HYDROCODONE-ACETAMINOPHEN 5-325 MG PO TABS: 2.0000 | ORAL_TABLET | ORAL | Status: DC | PRN

## 2013-11-16 MED ORDER — IOHEXOL 300 MG/ML  SOLN
20.0000 mL | INTRAMUSCULAR | Status: AC
  Administered 2013-11-16: 20 mL via ORAL

## 2013-11-16 MED ORDER — HYDROMORPHONE HCL PF 1 MG/ML IJ SOLN
0.5000 mg | Freq: Once | INTRAMUSCULAR | Status: AC
  Administered 2013-11-16: 0.5 mg via INTRAVENOUS
  Filled 2013-11-16: qty 1

## 2013-11-16 NOTE — Discharge Instructions (Signed)
Call your surgeon for close followup outpatient. Return for worsening pain, fevers, persistent vomiting or new concerns.  If you were given medicines take as directed.  If you are on coumadin or contraceptives realize their levels and effectiveness is altered by many different medicines.  If you have any reaction (rash, tongues swelling, other) to the medicines stop taking and see a physician.   Please follow up as directed and return to the ER or see a physician for new or worsening symptoms.  Thank you. Filed Vitals:   11/16/13 2030 11/16/13 2045 11/16/13 2100 11/16/13 2115  BP: 130/82 117/69 122/71 108/61  Pulse: 54 52 54 47  Temp:      TempSrc:      Resp:      SpO2: 99% 98% 99% 100%

## 2013-11-16 NOTE — Telephone Encounter (Signed)
Pt called with problems with her umbilical hernia.  She is having increasing pain and difficulty reducing it.  Went over how to lay flat on the bed to fully relax her abdominal muscles, then reduce the hernia and apply her abdominal binder.  She has been trying this but is not going very well.  Explained when she needs to go to the ED for pain that is severe and unrelieved, as this is a medical emergency.  Pt states she understands all.

## 2013-11-16 NOTE — ED Notes (Signed)
Resident at the bedside. Pt states the pain started last night.

## 2013-11-16 NOTE — ED Notes (Signed)
CT called to inform of patient finishing PO contrast. 

## 2013-11-16 NOTE — ED Notes (Signed)
Unable to move patient to C-Pod at this time per Charge RN. Nursing ratio will not allow at this time.

## 2013-11-16 NOTE — ED Notes (Addendum)
CT called to inquire about ETA for exam. Staff indicated they were waiting for pregnancy test result. Test currently in progress. Patient updated.

## 2013-11-16 NOTE — ED Notes (Signed)
Pt has umbilical hernia and sts it has popped out last night and she is in severe pain. sts scheduled on the 20th for consultation for surgery. sts unable to pop it back in.

## 2013-11-17 MED ORDER — HYDROCODONE-ACETAMINOPHEN 5-325 MG PO TABS
2.0000 | ORAL_TABLET | Freq: Once | ORAL | Status: AC
  Administered 2013-11-17: 2 via ORAL
  Filled 2013-11-17: qty 2

## 2013-11-23 NOTE — ED Provider Notes (Signed)
CSN: [id number]     Arrival date & time 11/16/13  1314 History   First MD Initiated Contact with Patient 11/16/13 1757     Chief Complaint  Patient presents with  . Hernia     (Consider location/radiation/quality/duration/timing/severity/associated sxs/prior Treatment) HPI Comments: 43 yo female with C section and umbilical hernia hx presents with constant pain above umbilicus since last night.  Similar to previous but has not had pain this long.  Pt feels she is unable to put it back in.  Pt has outpt fup.  No fevers or vomiting.   The history is provided by the patient.    Past Medical History  Diagnosis Date  . Umbilical hernia    Past Surgical History  Procedure Laterality Date  . Cesarean section    . Knee arthroplasty  2nd surgerywas ACL replacement   History reviewed. No pertinent family history. History  Substance Use Topics  . Smoking status: Passive Smoke Exposure - Never Smoker  . Smokeless tobacco: Not on file  . Alcohol Use: Yes     Comment: socially   OB History   Grav Para Term Preterm Abortions TAB SAB Ect Mult Living                 Review of Systems  Constitutional: Positive for appetite change. Negative for fever and chills.  HENT: Negative for congestion.   Eyes: Negative for visual disturbance.  Respiratory: Negative for shortness of breath.   Cardiovascular: Negative for chest pain.  Gastrointestinal: Positive for abdominal pain. Negative for vomiting.  Genitourinary: Negative for dysuria and flank pain.  Musculoskeletal: Negative for back pain, neck pain and neck stiffness.  Skin: Negative for rash.  Neurological: Negative for light-headedness and headaches.      Allergies  Penicillins; Latex; and Coconut oil  Home Medications   Prior to Admission medications   Medication Sig Start Date End Date Taking? Authorizing Provider  acidophilus (RISAQUAD) CAPS capsule Take 1 capsule by mouth daily.   Yes Historical Provider, MD   HYDROcodone-acetaminophen (NORCO) 5-325 MG per tablet Take 1 tablet by mouth every 6 (six) hours as needed for moderate pain. 10/04/13  Yes Tatyana A Kirichenko, PA-C  ibuprofen (ADVIL,MOTRIN) 200 MG tablet Take 800 mg by mouth 3 (three) times daily as needed (pain). For pain.   Yes Historical Provider, MD  promethazine (PHENERGAN) 25 MG tablet Take 1 tablet (25 mg total) by mouth every 6 (six) hours as needed for nausea or vomiting. 10/04/13  Yes Tatyana A Kirichenko, PA-C  HYDROcodone-acetaminophen (NORCO) 5-325 MG per tablet Take 2 tablets by mouth every 4 (four) hours as needed. 11/16/13   Enid SkeensJoshua M Katrell Milhorn, MD  ondansetron (ZOFRAN ODT) 4 MG disintegrating tablet 4mg  ODT q4 hours prn nausea/vomit 11/16/13   Enid SkeensJoshua M Zeke Aker, MD   BP 112/78  Pulse 62  Temp(Src) 98.1 F (36.7 C) (Oral)  Resp 18  SpO2 98%  LMP 11/14/2013 Physical Exam  Nursing note and vitals reviewed. Constitutional: She is oriented to person, place, and time. She appears well-developed and well-nourished.  HENT:  Head: Normocephalic and atraumatic.  Eyes: Conjunctivae are normal. Right eye exhibits no discharge. Left eye exhibits no discharge.  Neck: Normal range of motion. Neck supple. No tracheal deviation present.  Cardiovascular: Normal rate and regular rhythm.   Pulmonary/Chest: Effort normal and breath sounds normal.  Abdominal: Soft. She exhibits no distension. There is tenderness (mild central and supraumbilical, unable to appreciate significant hernia). There is no guarding.  Musculoskeletal: She  exhibits no edema.  Neurological: She is alert and oriented to person, place, and time.  Skin: Skin is warm. No rash noted.  Psychiatric: She has a normal mood and affect.    ED Course  Procedures (including critical care time) Labs Review Labs Reviewed  URINALYSIS, ROUTINE W REFLEX MICROSCOPIC - Abnormal; Notable for the following:    Hgb urine dipstick TRACE (*)    All other components within normal limits  CBC  WITH DIFFERENTIAL - Abnormal; Notable for the following:    Hemoglobin 15.2 (*)    All other components within normal limits  PREGNANCY, URINE  COMPREHENSIVE METABOLIC PANEL  URINE MICROSCOPIC-ADD ON  I-STAT CHEM 8, ED  I-STAT CG4 LACTIC ACID, ED    Imaging Review No results found. Ct Abdomen Pelvis W Contrast  11/16/2013   CLINICAL DATA:  Umbilical hernia with abdominal pain  EXAM: CT ABDOMEN AND PELVIS WITH CONTRAST  TECHNIQUE: Multidetector CT imaging of the abdomen and pelvis was performed using the standard protocol following bolus administration of intravenous contrast.  CONTRAST:  100mL OMNIPAQUE IOHEXOL 300 MG/ML  SOLN  COMPARISON:  None.  FINDINGS: Lung bases are free of acute infiltrate or sizable effusion.  The liver is diffusely fatty infiltrated. The gallbladder, spleen, adrenal glands and pancreas are within normal limits. The kidneys are well visualized and demonstrate a normal enhancement pattern. Normal excretion of contrast is noted. The appendix is well visualized and within normal limits. The bladder is well distended. No pelvic mass lesion is noted. Mild diverticular change of the colon is noted without diverticulitis.  A small fat containing umbilical hernia is noted. No incarcerated bowel loops are seen.  IMPRESSION: Small fat containing umbilical hernia. No other focal abnormality is noted.   Electronically Signed   By: Alcide CleverMark  Lukens M.D.   On: 11/16/2013 22:16    EKG Interpretation None      MDM   Final diagnoses:  Ventral hernia  Abdominal pain, generalized    Pt with known umbilical hernia with persistent pain. Pain medicines given, pain improved on recheck. CT scan delayed. CT scan reviewed, small fat contained in hernia.   Discussed outpt fup with G surgery.  Results and differential diagnosis were discussed with the patient/parent/guardian. Close follow up outpatient was discussed, comfortable with the plan.   Filed Vitals:   11/16/13 2045 11/16/13  2100 11/16/13 2115 11/16/13 2351  BP: 117/69 122/71 108/61 112/78  Pulse: 52 54 47 62  Temp:    98.1 F (36.7 C)  TempSrc:    Oral  Resp:    18  SpO2: 98% 99% 100% 98%         Enid SkeensJoshua M Saliou Barnier, MD 11/23/13 [phone]

## 2013-11-25 ENCOUNTER — Encounter (INDEPENDENT_AMBULATORY_CARE_PROVIDER_SITE_OTHER): Payer: Self-pay | Admitting: General Surgery

## 2013-11-25 ENCOUNTER — Ambulatory Visit (INDEPENDENT_AMBULATORY_CARE_PROVIDER_SITE_OTHER): Payer: BC Managed Care – PPO | Admitting: General Surgery

## 2013-11-25 VITALS — BP 136/80 | HR 73 | Temp 97.8°F | Ht 65.0 in | Wt 196.0 lb

## 2013-11-25 DIAGNOSIS — K429 Umbilical hernia without obstruction or gangrene: Secondary | ICD-10-CM

## 2013-11-25 NOTE — Progress Notes (Signed)
Patient ID: Courtney Clements, female   DOB: 05/22/1971, 43 y.o.   MRN: [id number]  Chief Complaint  Patient presents with  . Umbilical Hernia    HPI Courtney Clements is a 43 y.o. female.  The patient is a 43 year old female who was recently seen in the ER secondary to an incarcerated umbilical hernia. The patient was extubated twice the last 2 months. She said she had pain and some nausea her initial episode in March. A subsequent episode after Mother's Day in May returned with pain in her umbilicus.  Patient underwent CT scan which revealed an umbilical hernia.  HPI  Past Medical History  Diagnosis Date  . Umbilical hernia     Past Surgical History  Procedure Laterality Date  . Cesarean section    . Knee arthroplasty  2nd surgerywas ACL replacement    Family History  Problem Relation Age of Onset  . Hypertension Mother     Social History History  Substance Use Topics  . Smoking status: Passive Smoke Exposure - Never Smoker  . Smokeless tobacco: Not on file  . Alcohol Use: Yes     Comment: socially    Allergies  Allergen Reactions  . Penicillins Anaphylaxis  . Latex Other (See Comments)    irritation  . Coconut Oil Rash    Current Outpatient Prescriptions  Medication Sig Dispense Refill  . acidophilus (RISAQUAD) CAPS capsule Take 1 capsule by mouth daily.      . fexofenadine (ALLEGRA) 180 MG tablet Take 180 mg by mouth daily.      Marland Kitchen. ibuprofen (ADVIL,MOTRIN) 200 MG tablet Take 800 mg by mouth 3 (three) times daily as needed (pain). For pain.      . promethazine (PHENERGAN) 25 MG tablet Take 1 tablet (25 mg total) by mouth every 6 (six) hours as needed for nausea or vomiting.  10 tablet  0   No current facility-administered medications for this visit.    Review of Systems Review of Systems  Constitutional: Negative.   HENT: Negative.   Respiratory: Negative.   Cardiovascular: Negative.   Gastrointestinal: Negative.   Neurological: Negative.   All other systems  reviewed and are negative.   Blood pressure 136/80, pulse 73, temperature 97.8 F (36.6 C), height 5\' 5"  (1.651 m), weight 196 lb (88.905 kg), last menstrual period 11/14/2013.  Physical Exam Physical Exam  Constitutional: She is oriented to person, place, and time. She appears well-developed and well-nourished.  HENT:  Head: Normocephalic and atraumatic.  Eyes: Conjunctivae and EOM are normal. Pupils are equal, round, and reactive to light.  Neck: Normal range of motion. Neck supple.  Cardiovascular: Normal rate, regular rhythm and normal heart sounds.   Pulmonary/Chest: Effort normal and breath sounds normal.  Abdominal: Soft. Bowel sounds are normal. She exhibits no distension and no mass. There is no tenderness. There is no rebound and no guarding. A hernia is present.    Musculoskeletal: Normal range of motion.  Neurological: She is alert and oriented to person, place, and time.  Skin: Skin is warm and dry.  Psychiatric: She has a normal mood and affect.    Data Reviewed As above  Assessment    43 year old female with an umbilical hernia     Plan    1. The patient would like to proceed to the operating room for a laparoscopic umbilical hernia repair with mesh. 2. All risks and benefits were discussed with the patient, to generally include infection, bleeding, damage to surrounding structures, acute and  chronic nerve pain, and recurrence. Alternatives were offered and described.  All questions were answered and the patient voiced understanding of the procedure and wishes to proceed at this point.         Axel Filler 11/25/2013, 10:48 AM

## 2013-11-27 ENCOUNTER — Encounter (HOSPITAL_COMMUNITY): Payer: Self-pay

## 2013-12-02 NOTE — Pre-Procedure Instructions (Signed)
Courtney Clements  12/02/2013   Your procedure is scheduled on:  Tues, June 2 @ 11:55 AM  Report to [facility] En[facility]  at 9;45 AM.  Call this number if you have problems the morning of surgery: [phone]   Remember:   Do not eat food or drink liquids after midnight.   Take these medicines the morning of surgery with A SIP OF WATER: Allegra(Fexofenadine) and Phenergan(Promethazine-if needed)              Stop taking your Ibuprofen. No Goody's,BC's,Aleve,Aspirin,Fish Oil,or any Herbal Medications   Do not wear jewelry, make-up or nail polish.  Do not wear lotions, powders, or perfumes. You may wear deodorant.  Do not shave 48 hours prior to surgery.   Do not bring valuables to the hospital.  [facility] is not responsible                  for any belongings or valuables.               Contacts, dentures or bridgework may not be worn into surgery.  Leave suitcase in the car. After surgery it may be brought to your room.  For patients admitted to the hospital, discharge time is determined by your                treatment team.               Patients discharged the day of surgery will not be allowed to drive  home.    Special Instructions:  [facility] - Preparing for Surgery  Before surgery, you can play an important role.  Because skin is not sterile, your skin needs to be as free of germs as possible.  You can reduce the number of germs on you skin by washing with CHG (chlorahexidine gluconate) soap before surgery.  CHG is an antiseptic cleaner which kills germs and bonds with the skin to continue killing germs even after washing.  Please DO NOT use if you have an allergy to CHG or antibacterial soaps.  If your skin becomes reddened/irritated stop using the CHG and inform your nurse when you arrive at Short Stay.  Do not shave (including legs and underarms) for at least 48 hours prior to the first CHG shower.  You may shave your face.  Please follow these instructions  carefully:   1.  Shower with CHG Soap the night before surgery and the                                morning of Surgery.  2.  If you choose to wash your hair, wash your hair first as usual with your       normal shampoo.  3.  After you shampoo, rinse your hair and body thoroughly to remove the                      Shampoo.  4.  Use CHG as you would any other liquid soap.  You can apply chg directly       to the skin and wash gently with scrungie or a clean washcloth.  5.  Apply the CHG Soap to your body ONLY FROM THE NECK DOWN.        Do not use on open wounds or open sores.  Avoid contact with your eyes,       ears,  mouth and genitals (private parts).  Wash genitals (private parts)       with your normal soap.  6.  Wash thoroughly, paying special attention to the area where your surgery        will be performed.  7.  Thoroughly rinse your body with warm water from the neck down.  8.  DO NOT shower/wash with your normal soap after using and rinsing off       the CHG Soap.  9.  Pat yourself dry with a clean towel.            10.  Wear clean pajamas.            11.  Place clean sheets on your bed the night of your first shower and do not        sleep with pets.  Day of Surgery  Do not apply any lotions/deoderants the morning of surgery.  Please wear clean clothes to the hospital/surgery center.     Please read over the following fact sheets that you were given: Pain Booklet, Coughing and Deep Breathing and Surgical Site Infection Prevention

## 2013-12-03 ENCOUNTER — Encounter (HOSPITAL_COMMUNITY)
Admission: RE | Admit: 2013-12-03 | Discharge: 2013-12-03 | Disposition: A | Discharge status: Home / Self Care | Payer: BC Managed Care – PPO | Source: Ambulatory Visit | Attending: General Surgery | Admitting: General Surgery

## 2013-12-03 ENCOUNTER — Encounter (HOSPITAL_COMMUNITY): Payer: Self-pay

## 2013-12-03 DIAGNOSIS — Z01812 Encounter for preprocedural laboratory examination: Secondary | ICD-10-CM | POA: Insufficient documentation

## 2013-12-03 HISTORY — DX: Gastro-esophageal reflux disease without esophagitis: K21.9

## 2013-12-03 HISTORY — DX: Other seasonal allergic rhinitis: J30.2

## 2013-12-03 HISTORY — DX: Unspecified osteoarthritis, unspecified site: M19.90

## 2013-12-03 HISTORY — DX: Other constipation: K59.09

## 2013-12-03 HISTORY — DX: Family history of other specified conditions: Z84.89

## 2013-12-03 HISTORY — DX: Pneumonia, unspecified organism: J18.9

## 2013-12-03 HISTORY — DX: Diarrhea, unspecified: R19.7

## 2013-12-03 HISTORY — DX: Other specified postprocedural states: Z98.890

## 2013-12-03 HISTORY — DX: Personal history of other diseases of the nervous system and sense organs: Z86.69

## 2013-12-03 HISTORY — DX: Pain in unspecified joint: M25.50

## 2013-12-03 HISTORY — DX: Personal history of other diseases of the respiratory system: Z87.09

## 2013-12-03 HISTORY — DX: Unspecified hemorrhoids: K64.9

## 2013-12-03 HISTORY — DX: Nausea with vomiting, unspecified: R11.2

## 2013-12-03 LAB — CBC
HCT: 45.7 % (ref 36.0–46.0)
Hemoglobin: 14.8 g/dL (ref 12.0–15.0)
MCH: 30.6 pg (ref 26.0–34.0)
MCHC: 32.4 g/dL (ref 30.0–36.0)
MCV: 94.4 fL (ref 78.0–100.0)
Platelets: 292 10*3/uL (ref 150–400)
RBC: 4.84 MIL/uL (ref 3.87–5.11)
RDW: 12.6 % (ref 11.5–15.5)
WBC: 9.4 10*3/uL (ref 4.0–10.5)

## 2013-12-03 LAB — HCG, SERUM, QUALITATIVE: PREG SERUM: NEGATIVE

## 2013-12-03 MED ORDER — CHLORHEXIDINE GLUCONATE 4 % EX LIQD: 1.0000 "application " | Freq: Once | CUTANEOUS | Status: DC

## 2013-12-03 NOTE — Progress Notes (Addendum)
Pt doesn't have a cardiologist  Denies ever having an echo/stress test/heart cath  Denies EKG or CXR in past yr   Medical Md is Dr.Aaron Kateri Plummer

## 2013-12-07 MED ORDER — VANCOMYCIN HCL IN DEXTROSE 1-5 GM/200ML-% IV SOLN
1000.0000 mg | INTRAVENOUS | Status: AC
  Administered 2013-12-08: 1000 mg via INTRAVENOUS
  Filled 2013-12-07: qty 200

## 2013-12-08 ENCOUNTER — Ambulatory Visit (HOSPITAL_COMMUNITY)
Admission: RE | Admit: 2013-12-08 | Discharge: 2013-12-08 | Disposition: A | Discharge status: Home / Self Care | Payer: BC Managed Care – PPO | Source: Ambulatory Visit | Attending: General Surgery | Admitting: General Surgery

## 2013-12-08 ENCOUNTER — Encounter (HOSPITAL_COMMUNITY): Payer: Self-pay | Admitting: *Deleted

## 2013-12-08 ENCOUNTER — Encounter (HOSPITAL_COMMUNITY)
Admission: RE | Disposition: A | Discharge status: Home / Self Care | Payer: Self-pay | Source: Ambulatory Visit | Attending: General Surgery

## 2013-12-08 ENCOUNTER — Encounter (HOSPITAL_COMMUNITY): Payer: BC Managed Care – PPO | Admitting: Anesthesiology

## 2013-12-08 ENCOUNTER — Ambulatory Visit (HOSPITAL_COMMUNITY): Payer: BC Managed Care – PPO | Admitting: Anesthesiology

## 2013-12-08 DIAGNOSIS — K429 Umbilical hernia without obstruction or gangrene: Secondary | ICD-10-CM | POA: Insufficient documentation

## 2013-12-08 DIAGNOSIS — K219 Gastro-esophageal reflux disease without esophagitis: Secondary | ICD-10-CM | POA: Insufficient documentation

## 2013-12-08 DIAGNOSIS — Z87891 Personal history of nicotine dependence: Secondary | ICD-10-CM | POA: Insufficient documentation

## 2013-12-08 DIAGNOSIS — Z79899 Other long term (current) drug therapy: Secondary | ICD-10-CM | POA: Insufficient documentation

## 2013-12-08 DIAGNOSIS — M129 Arthropathy, unspecified: Secondary | ICD-10-CM | POA: Insufficient documentation

## 2013-12-08 DIAGNOSIS — Z6832 Body mass index (BMI) 32.0-32.9, adult: Secondary | ICD-10-CM | POA: Insufficient documentation

## 2013-12-08 DIAGNOSIS — E669 Obesity, unspecified: Secondary | ICD-10-CM | POA: Insufficient documentation

## 2013-12-08 HISTORY — PX: INSERTION OF MESH: SHX5868

## 2013-12-08 HISTORY — PX: UMBILICAL HERNIA REPAIR: SHX196

## 2013-12-08 SURGERY — REPAIR, HERNIA, UMBILICAL, LAPAROSCOPIC
Anesthesia: General | Site: Abdomen

## 2013-12-08 MED ORDER — LIDOCAINE HCL (CARDIAC) 20 MG/ML IV SOLN
INTRAVENOUS | Status: DC | PRN
  Administered 2013-12-08: 80 mg via INTRAVENOUS

## 2013-12-08 MED ORDER — OXYCODONE HCL 5 MG/5ML PO SOLN: 5.0000 mg | Freq: Once | ORAL | Status: DC | PRN

## 2013-12-08 MED ORDER — SUCCINYLCHOLINE CHLORIDE 20 MG/ML IJ SOLN
INTRAMUSCULAR | Status: DC | PRN
  Administered 2013-12-08: 100 mg via INTRAVENOUS

## 2013-12-08 MED ORDER — OXYCODONE-ACETAMINOPHEN 5-325 MG PO TABS: 1.0000 | ORAL_TABLET | ORAL | Status: DC | PRN

## 2013-12-08 MED ORDER — HYDROMORPHONE HCL PF 1 MG/ML IJ SOLN
INTRAMUSCULAR | Status: AC
  Filled 2013-12-08: qty 1

## 2013-12-08 MED ORDER — GLYCOPYRROLATE 0.2 MG/ML IJ SOLN
INTRAMUSCULAR | Status: AC
  Filled 2013-12-08: qty 2

## 2013-12-08 MED ORDER — HYDROMORPHONE HCL PF 1 MG/ML IJ SOLN
0.2500 mg | INTRAMUSCULAR | Status: DC | PRN
  Administered 2013-12-08 (×4): 0.5 mg via INTRAVENOUS

## 2013-12-08 MED ORDER — PROPOFOL 10 MG/ML IV BOLUS
INTRAVENOUS | Status: AC
  Filled 2013-12-08: qty 20

## 2013-12-08 MED ORDER — KETOROLAC TROMETHAMINE 30 MG/ML IJ SOLN
INTRAMUSCULAR | Status: DC | PRN
  Administered 2013-12-08: 30 mg via INTRAVENOUS

## 2013-12-08 MED ORDER — FENTANYL CITRATE 0.05 MG/ML IJ SOLN
INTRAMUSCULAR | Status: AC
  Filled 2013-12-08: qty 5

## 2013-12-08 MED ORDER — VECURONIUM BROMIDE 10 MG IV SOLR
INTRAVENOUS | Status: AC
  Filled 2013-12-08: qty 10

## 2013-12-08 MED ORDER — NEOSTIGMINE METHYLSULFATE 10 MG/10ML IV SOLN
INTRAVENOUS | Status: DC | PRN
  Administered 2013-12-08: 3 mg via INTRAVENOUS

## 2013-12-08 MED ORDER — ONDANSETRON HCL 4 MG/2ML IJ SOLN
INTRAMUSCULAR | Status: DC | PRN
  Administered 2013-12-08: 4 mg via INTRAVENOUS

## 2013-12-08 MED ORDER — SCOPOLAMINE 1 MG/3DAYS TD PT72
MEDICATED_PATCH | TRANSDERMAL | Status: DC | PRN
  Administered 2013-12-08: 1 via TRANSDERMAL

## 2013-12-08 MED ORDER — ONDANSETRON HCL 4 MG/2ML IJ SOLN
INTRAMUSCULAR | Status: AC
  Filled 2013-12-08: qty 2

## 2013-12-08 MED ORDER — DEXAMETHASONE SODIUM PHOSPHATE 10 MG/ML IJ SOLN
INTRAMUSCULAR | Status: AC
  Filled 2013-12-08: qty 1

## 2013-12-08 MED ORDER — MIDAZOLAM HCL 5 MG/5ML IJ SOLN
INTRAMUSCULAR | Status: DC | PRN
  Administered 2013-12-08: 2 mg via INTRAVENOUS

## 2013-12-08 MED ORDER — LIDOCAINE HCL (CARDIAC) 20 MG/ML IV SOLN
INTRAVENOUS | Status: AC
  Filled 2013-12-08: qty 5

## 2013-12-08 MED ORDER — ONDANSETRON HCL 4 MG/2ML IJ SOLN: 4.0000 mg | Freq: Four times a day (QID) | INTRAMUSCULAR | Status: DC | PRN

## 2013-12-08 MED ORDER — SODIUM CHLORIDE 0.9 % IJ SOLN
INTRAMUSCULAR | Status: AC
  Filled 2013-12-08: qty 10

## 2013-12-08 MED ORDER — METOCLOPRAMIDE HCL 5 MG/ML IJ SOLN
INTRAMUSCULAR | Status: DC | PRN
Start: 2013-12-08 — End: 2013-12-08
  Administered 2013-12-08: 10 mg via INTRAVENOUS

## 2013-12-08 MED ORDER — METOCLOPRAMIDE HCL 5 MG/ML IJ SOLN
INTRAMUSCULAR | Status: AC
  Filled 2013-12-08: qty 2

## 2013-12-08 MED ORDER — VECURONIUM BROMIDE 10 MG IV SOLR
INTRAVENOUS | Status: DC | PRN
  Administered 2013-12-08: 2 mg via INTRAVENOUS

## 2013-12-08 MED ORDER — MIDAZOLAM HCL 2 MG/2ML IJ SOLN
INTRAMUSCULAR | Status: AC
  Filled 2013-12-08: qty 2

## 2013-12-08 MED ORDER — SUCCINYLCHOLINE CHLORIDE 20 MG/ML IJ SOLN
INTRAMUSCULAR | Status: AC
  Filled 2013-12-08: qty 1

## 2013-12-08 MED ORDER — DIPHENHYDRAMINE HCL 50 MG/ML IJ SOLN
INTRAMUSCULAR | Status: DC | PRN
  Administered 2013-12-08: 25 mg via INTRAVENOUS

## 2013-12-08 MED ORDER — BUPIVACAINE HCL 0.25 % IJ SOLN
INTRAMUSCULAR | Status: DC | PRN
  Administered 2013-12-08: 7 mL

## 2013-12-08 MED ORDER — GLYCOPYRROLATE 0.2 MG/ML IJ SOLN
INTRAMUSCULAR | Status: DC | PRN
  Administered 2013-12-08: 0.4 mg via INTRAVENOUS

## 2013-12-08 MED ORDER — 0.9 % SODIUM CHLORIDE (POUR BTL) OPTIME
TOPICAL | Status: DC | PRN
  Administered 2013-12-08: 1000 mL

## 2013-12-08 MED ORDER — FENTANYL CITRATE 0.05 MG/ML IJ SOLN
INTRAMUSCULAR | Status: DC | PRN
  Administered 2013-12-08 (×2): 100 ug via INTRAVENOUS
  Administered 2013-12-08: 50 ug via INTRAVENOUS

## 2013-12-08 MED ORDER — OXYCODONE HCL 5 MG PO TABS: 5.0000 mg | ORAL_TABLET | Freq: Once | ORAL | Status: DC | PRN

## 2013-12-08 MED ORDER — PROPOFOL 10 MG/ML IV BOLUS
INTRAVENOUS | Status: DC | PRN
  Administered 2013-12-08: 170 mg via INTRAVENOUS

## 2013-12-08 MED ORDER — SCOPOLAMINE 1 MG/3DAYS TD PT72
MEDICATED_PATCH | TRANSDERMAL | Status: AC
  Filled 2013-12-08: qty 1

## 2013-12-08 MED ORDER — DEXAMETHASONE SODIUM PHOSPHATE 10 MG/ML IJ SOLN
INTRAMUSCULAR | Status: DC | PRN
  Administered 2013-12-08: 10 mg via INTRAVENOUS

## 2013-12-08 MED ORDER — LACTATED RINGERS IV SOLN
INTRAVENOUS | Status: DC
  Administered 2013-12-08 (×2): via INTRAVENOUS

## 2013-12-08 MED ORDER — KETOROLAC TROMETHAMINE 30 MG/ML IJ SOLN
INTRAMUSCULAR | Status: AC
  Filled 2013-12-08: qty 1

## 2013-12-08 MED ORDER — NEOSTIGMINE METHYLSULFATE 10 MG/10ML IV SOLN
INTRAVENOUS | Status: AC
  Filled 2013-12-08: qty 1

## 2013-12-08 MED ORDER — HYDROMORPHONE HCL PF 1 MG/ML IJ SOLN
INTRAMUSCULAR | Status: DC | PRN
  Administered 2013-12-08: 1 mg via INTRAVENOUS

## 2013-12-08 SURGICAL SUPPLY — 46 items
APPLIER CLIP LOGIC TI 5 (MISCELLANEOUS) IMPLANT
APPLIER CLIP ROT 10 11.4 M/L (STAPLE)
BENZOIN TINCTURE PRP APPL 2/3 (GAUZE/BANDAGES/DRESSINGS) ×4 IMPLANT
BLADE SURG ROTATE 9660 (MISCELLANEOUS) IMPLANT
CANISTER SUCTION 2500CC (MISCELLANEOUS) IMPLANT
CHLORAPREP W/TINT 26ML (MISCELLANEOUS) ×4 IMPLANT
CLIP APPLIE ROT 10 11.4 M/L (STAPLE) IMPLANT
COVER SURGICAL LIGHT HANDLE (MISCELLANEOUS) ×4 IMPLANT
DEVICE SECURE STRAP 25 ABSORB (INSTRUMENTS) ×4 IMPLANT
DEVICE TROCAR PUNCTURE CLOSURE (ENDOMECHANICALS) ×4 IMPLANT
DRAPE UTILITY 15X26 W/TAPE STR (DRAPE) ×8 IMPLANT
ELECT REM PT RETURN 9FT ADLT (ELECTROSURGICAL) ×4
ELECTRODE REM PT RTRN 9FT ADLT (ELECTROSURGICAL) ×2 IMPLANT
GLOVE BIO SURGEON STRL SZ7.5 (GLOVE) ×4 IMPLANT
GLOVE BIO SURGEON STRL SZ8 (GLOVE) ×4 IMPLANT
GLOVE BIOGEL PI IND STRL 7.0 (GLOVE) ×4 IMPLANT
GLOVE BIOGEL PI IND STRL 8 (GLOVE) ×2 IMPLANT
GLOVE BIOGEL PI INDICATOR 7.0 (GLOVE) ×4
GLOVE BIOGEL PI INDICATOR 8 (GLOVE) ×2
GLOVE SURG SS PI 6.5 STRL IVOR (GLOVE) ×4 IMPLANT
GOWN STRL REUS W/ TWL LRG LVL3 (GOWN DISPOSABLE) ×4 IMPLANT
GOWN STRL REUS W/ TWL XL LVL3 (GOWN DISPOSABLE) ×2 IMPLANT
GOWN STRL REUS W/TWL LRG LVL3 (GOWN DISPOSABLE) ×4
GOWN STRL REUS W/TWL XL LVL3 (GOWN DISPOSABLE) ×2
KIT BASIN OR (CUSTOM PROCEDURE TRAY) ×4 IMPLANT
KIT ROOM TURNOVER OR (KITS) ×4 IMPLANT
MARKER SKIN DUAL TIP RULER LAB (MISCELLANEOUS) ×4 IMPLANT
MESH PARIETEX 4.7 (Mesh General) ×4 IMPLANT
NEEDLE INSUFFLATION 14GA 120MM (NEEDLE) ×4 IMPLANT
NEEDLE SPNL 22GX3.5 QUINCKE BK (NEEDLE) IMPLANT
NS IRRIG 1000ML POUR BTL (IV SOLUTION) ×4 IMPLANT
PAD ARMBOARD 7.5X6 YLW CONV (MISCELLANEOUS) ×8 IMPLANT
SCISSORS LAP 5X35 DISP (ENDOMECHANICALS) ×4 IMPLANT
SET IRRIG TUBING LAPAROSCOPIC (IRRIGATION / IRRIGATOR) IMPLANT
SLEEVE ENDOPATH XCEL 5M (ENDOMECHANICALS) ×4 IMPLANT
SPONGE GAUZE 4X4 12PLY (GAUZE/BANDAGES/DRESSINGS) ×4 IMPLANT
SUT CHROMIC 2 0 SH (SUTURE) ×4 IMPLANT
SUT MNCRL AB 4-0 PS2 18 (SUTURE) ×4 IMPLANT
SUT PROLENE 2 0 KS (SUTURE) IMPLANT
TOWEL OR 17X24 6PK STRL BLUE (TOWEL DISPOSABLE) ×4 IMPLANT
TOWEL OR 17X26 10 PK STRL BLUE (TOWEL DISPOSABLE) ×4 IMPLANT
TRAY FOLEY CATH 14FR (SET/KITS/TRAYS/PACK) IMPLANT
TRAY LAPAROSCOPIC (CUSTOM PROCEDURE TRAY) ×4 IMPLANT
TROCAR XCEL BLUNT TIP 100MML (ENDOMECHANICALS) IMPLANT
TROCAR XCEL NON-BLD 11X100MML (ENDOMECHANICALS) IMPLANT
TROCAR XCEL NON-BLD 5MMX100MML (ENDOMECHANICALS) ×4 IMPLANT

## 2013-12-08 NOTE — Discharge Instructions (Signed)
[facility] _______[facility] ° °UMBILICAL  HERNIA REPAIR: POST OP INSTRUCTIONS ° °Always review your discharge instruction sheet given to you by the facility where your surgery was performed. °IF YOU HAVE DISABILITY OR FAMILY LEAVE FORMS, YOU MUST BRING THEM TO THE OFFICE FOR PROCESSING.   °DO NOT GIVE THEM TO YOUR DOCTOR. ° °1. A  prescription for pain medication may be given to you upon discharge.  Take your pain medication as prescribed, if needed.  If narcotic pain medicine is not needed, then you may take acetaminophen (Tylenol) or ibuprofen (Advil) as needed. °2. Take your usually prescribed medications unless otherwise directed. °3. If you need a refill on your pain medication, please contact your pharmacy.  They will contact our office to request authorization. Prescriptions will not be filled after 5 pm or on week-ends. °4. You should follow a light diet the first 24 hours after arrival home, such as soup and crackers, etc.  Be sure to include lots of fluids daily.  Resume your normal diet the day after surgery. °5. Most patients will experience some swelling and bruising around the umbilicus or in the groin and scrotum.  Ice packs and reclining will help.  Swelling and bruising can take several days to resolve.  °6. It is common to experience some constipation if taking pain medication after surgery.  Increasing fluid intake and taking a stool softener (such as Colace) will usually help or prevent this problem from occurring.  A mild laxative (Milk of Magnesia or Miralax) should be taken according to package directions if there are no bowel movements after 48 hours. °7. Unless discharge instructions indicate otherwise, you may remove your bandages 24-48 hours after surgery, and you may shower at that time.  You may have steri-strips (small skin tapes) in place directly over the incision.  These strips should be left on the skin for 7-10 days.  If your surgeon used skin glue on the incision, you  may shower in 24 hours.  The glue will flake off over the next 2-3 weeks.  Any sutures or staples will be removed at the office during your follow-up visit. °8. ACTIVITIES:  You may resume regular (light) daily activities beginning the next day--such as daily self-care, walking, climbing stairs--gradually increasing activities as tolerated.  You may have sexual intercourse when it is comfortable.  Refrain from any heavy lifting or straining until approved by your doctor. °a. You may drive when you are no longer taking prescription pain medication, you can comfortably wear a seatbelt, and you can safely maneuver your car and apply brakes. °b. RETURN TO WORK:  __________________________________________________________ °9. You should see your doctor in the office for a follow-up appointment approximately 2-3 weeks after your surgery.  Make sure that you call for this appointment within a day or two after you arrive home to insure a convenient appointment time. °10. OTHER INSTRUCTIONS:  __________________________________________________________________________________________________________________________________________________________________________________________  °WHEN TO CALL YOUR DOCTOR: °1. Fever over 101.0 °2. Inability to urinate °3. Nausea and/or vomiting °4. Extreme swelling or bruising °5. Continued bleeding from incision. °6. Increased pain, redness, or drainage from the incision ° °The clinic staff is available to answer your questions during regular business hours.  Please don’t hesitate to call and ask to speak to one of the nurses for clinical concerns.  If you have a medical emergency, go to the nearest emergency room or call 911.  A surgeon from [facility] is always on call at the hospital ° ° °[address], [address], [place], [place]  [address] ? ° [address] [address], [place], [place]   [address] °[phone] ? [phone] ? FAX [phone] °Web site:  [email or web address] ° °What to eat: ° °For your first meals, you should eat lightly; only small meals initially.  If you do not have nausea, you may eat larger meals.  Avoid spicy, greasy and heavy food.   ° °General Anesthesia, Adult, Care After  °Refer to this sheet in the next few weeks. These instructions provide you with information on caring for yourself after your procedure. Your health care provider may also give you more specific instructions. Your treatment has been planned according to current medical practices, but problems sometimes occur. Call your health care provider if you have any problems or questions after your procedure.  °WHAT TO EXPECT AFTER THE PROCEDURE  °After the procedure, it is typical to experience:  °Sleepiness.  °Nausea and vomiting. °HOME CARE INSTRUCTIONS  °For the first 24 hours after general anesthesia:  °Have a responsible person with you.  °Do not drive a car. If you are alone, do not take public transportation.  °Do not drink alcohol.  °Do not take medicine that has not been prescribed by your health care provider.  °Do not sign important papers or make important decisions.  °You may resume a normal diet and activities as directed by your health care provider.  °Change bandages (dressings) as directed.  °If you have questions or problems that seem related to general anesthesia, call the hospital and ask for the anesthetist or anesthesiologist on call. °SEEK MEDICAL CARE IF:  °You have nausea and vomiting that continue the day after anesthesia.  °You develop a rash. °SEEK IMMEDIATE MEDICAL CARE IF:  °You have difficulty breathing.  °You have chest pain.  °You have any allergic problems. °Document Released: 10/01/2000 Document Revised: 02/25/2013 Document Reviewed: 01/08/2013  °ExitCare® Patient Information ©2014 [employer].  ° ° °

## 2013-12-08 NOTE — Anesthesia Preprocedure Evaluation (Signed)
Anesthesia Evaluation  Patient identified by MRN, date of birth, ID band Patient awake    Reviewed: Allergy & Precautions, H&P , NPO status , Patient's Chart, lab work & pertinent test results  History of Anesthesia Complications (+) PONV  Airway Mallampati: II  Neck ROM: full    Dental   Pulmonary pneumonia -, former smoker,          Cardiovascular negative cardio ROS      Neuro/Psych    GI/Hepatic GERD-  ,  Endo/Other  obese  Renal/GU      Musculoskeletal  (+) Arthritis -,   Abdominal   Peds  Hematology   Anesthesia Other Findings   Reproductive/Obstetrics                           Anesthesia Physical Anesthesia Plan  ASA: II  Anesthesia Plan: General   Post-op Pain Management:    Induction: Intravenous  Airway Management Planned: Oral ETT  Additional Equipment:   Intra-op Plan:   Post-operative Plan: Extubation in OR  Informed Consent: I have reviewed the patients History and Physical, chart, labs and discussed the procedure including the risks, benefits and alternatives for the proposed anesthesia with the patient or authorized representative who has indicated his/her understanding and acceptance.     Plan Discussed with: CRNA, Anesthesiologist and Surgeon  Anesthesia Plan Comments:         Anesthesia Quick Evaluation

## 2013-12-08 NOTE — Anesthesia Procedure Notes (Signed)
Procedure Name: Intubation Date/Time: 12/08/2013 10:59 AM Performed by: Arlice Colt B Pre-anesthesia Checklist: Patient identified, Emergency Drugs available, Suction available, Patient being monitored and Timeout performed Patient Re-evaluated:Patient Re-evaluated prior to inductionOxygen Delivery Method: Circle system utilized Preoxygenation: Pre-oxygenation with 100% oxygen Intubation Type: IV induction and Rapid sequence Laryngoscope Size: Mac and 3 Grade View: Grade I Tube type: Oral Tube size: 7.0 mm Number of attempts: 1 Airway Equipment and Method: Stylet Placement Confirmation: ETT inserted through vocal cords under direct vision,  positive ETCO2 and breath sounds checked- equal and bilateral Secured at: 21 cm Tube secured with: Tape Dental Injury: Teeth and Oropharynx as per pre-operative assessment

## 2013-12-08 NOTE — Op Note (Signed)
12/08/2013  11:36 AM  PATIENT:  Courtney Clements  43 y.o. female  PRE-OPERATIVE DIAGNOSIS:  umbilical hernia   POST-OPERATIVE DIAGNOSIS:  umbilical hernia   PROCEDURE:  Procedure(s): LAPAROSCOPIC UMBILICAL HERNIA (N/A) INSERTION OF MESH (N/A)  SURGEON:  Surgeon(s) and Role:    * Axel Filler, MD - Primary  ASSISTANTS: none   ANESTHESIA:   local and general  EBL:  Total I/O In: 1000 [I.V.:1000] Out: -   BLOOD ADMINISTERED:none  DRAINS: none   LOCAL MEDICATIONS USED:  BUPIVICAINE   SPECIMEN:  No Specimen  DISPOSITION OF SPECIMEN:  N/A  COUNTS:  YES  TOURNIQUET:  * No tourniquets in log *  DICTATION: .Dragon Dictation Details of the procedure:   After the patient was consented patient was taken back to the operating room patient was then placed in supine position bilateral SCDs in place. After antibiotics were confirmed a timeout was called and all facts were verified. The Veress needle technique was used to insuflate the abdomen at Palmer's point. The abdomen was insufflated to 14 mm mercury. Subsequently a 5 mm trocar was placed a camera inserted there was no injury to any intra-abdominal organs.  There was seen to be a 1cm hernia.  A second camera port was in placed into the left lower quadrant.   At this the Falicform ligament was taken down  with Bovie cautery maintaining hemostasis. A 24mm port was placed in the epigastrium . I proceeded to reduce the hernia contents.  Once the hernia was cleared away, a Parietex PCO 12cm  mesh was inserted into the abdomen.  The mesh was secured circumferentially with am Securestrap tacker in a double crown fashion. This overlapped the hernia by > 4cm.  The falciform ligament was then brought up to the abdominal wall and secured with the Securestrap. The omentum was brought over the area of the mesh. The pneumoperitoneum was evacuated  & all trocars  were removed. The skin was reapproximated with 4-0  Monocryl sutures in a subcuticular  fashion. The skin was dressed with Steri-Strips tape and gauze.  The patient was taken to the recovery room in stable condition.     PLAN OF CARE: Discharge to home after PACU  PATIENT DISPOSITION:  PACU - hemodynamically stable.   Delay start of Pharmacological VTE agent (>24hrs) due to surgical blood loss or risk of bleeding: not applicable

## 2013-12-08 NOTE — H&P (View-Only) (Signed)
Patient ID: Courtney RotaDawn C Clements, female   DOB: 05/22/1971, 43 y.o.   MRN: [id number]  Chief Complaint  Patient presents with  . Umbilical Hernia    HPI Courtney Clements is a 43 y.o. female.  The patient is a 43 year old female who was recently seen in the ER secondary to an incarcerated umbilical hernia. The patient was extubated twice the last 2 months. She said she had pain and some nausea her initial episode in March. A subsequent episode after Mother's Day in May returned with pain in her umbilicus.  Patient underwent CT scan which revealed an umbilical hernia.  HPI  Past Medical History  Diagnosis Date  . Umbilical hernia     Past Surgical History  Procedure Laterality Date  . Cesarean section    . Knee arthroplasty  2nd surgerywas ACL replacement    Family History  Problem Relation Age of Onset  . Hypertension Mother     Social History History  Substance Use Topics  . Smoking status: Passive Smoke Exposure - Never Smoker  . Smokeless tobacco: Not on file  . Alcohol Use: Yes     Comment: socially    Allergies  Allergen Reactions  . Penicillins Anaphylaxis  . Latex Other (See Comments)    irritation  . Coconut Oil Rash    Current Outpatient Prescriptions  Medication Sig Dispense Refill  . acidophilus (RISAQUAD) CAPS capsule Take 1 capsule by mouth daily.      . fexofenadine (ALLEGRA) 180 MG tablet Take 180 mg by mouth daily.      Marland Kitchen. ibuprofen (ADVIL,MOTRIN) 200 MG tablet Take 800 mg by mouth 3 (three) times daily as needed (pain). For pain.      . promethazine (PHENERGAN) 25 MG tablet Take 1 tablet (25 mg total) by mouth every 6 (six) hours as needed for nausea or vomiting.  10 tablet  0   No current facility-administered medications for this visit.    Review of Systems Review of Systems  Constitutional: Negative.   HENT: Negative.   Respiratory: Negative.   Cardiovascular: Negative.   Gastrointestinal: Negative.   Neurological: Negative.   All other systems  reviewed and are negative.   Blood pressure 136/80, pulse 73, temperature 97.8 F (36.6 C), height 5\' 5"  (1.651 m), weight 196 lb (88.905 kg), last menstrual period 11/14/2013.  Physical Exam Physical Exam  Constitutional: She is oriented to person, place, and time. She appears well-developed and well-nourished.  HENT:  Head: Normocephalic and atraumatic.  Eyes: Conjunctivae and EOM are normal. Pupils are equal, round, and reactive to light.  Neck: Normal range of motion. Neck supple.  Cardiovascular: Normal rate, regular rhythm and normal heart sounds.   Pulmonary/Chest: Effort normal and breath sounds normal.  Abdominal: Soft. Bowel sounds are normal. She exhibits no distension and no mass. There is no tenderness. There is no rebound and no guarding. A hernia is present.    Musculoskeletal: Normal range of motion.  Neurological: She is alert and oriented to person, place, and time.  Skin: Skin is warm and dry.  Psychiatric: She has a normal mood and affect.    Data Reviewed As above  Assessment    43 year old female with an umbilical hernia     Plan    1. The patient would like to proceed to the operating room for a laparoscopic umbilical hernia repair with mesh. 2. All risks and benefits were discussed with the patient, to generally include infection, bleeding, damage to surrounding structures, acute and  chronic nerve pain, and recurrence. Alternatives were offered and described.  All questions were answered and the patient voiced understanding of the procedure and wishes to proceed at this point.         Axel Filler 11/25/2013, 10:48 AM

## 2013-12-08 NOTE — Anesthesia Postprocedure Evaluation (Signed)
Anesthesia Post Note  Patient: Courtney Clements  Procedure(s) Performed: Procedure(s) (LRB): LAPAROSCOPIC UMBILICAL HERNIA (N/A) INSERTION OF MESH (N/A)  Anesthesia type: General  Patient location: PACU  Post pain: Pain level controlled and Adequate analgesia  Post assessment: Post-op Vital signs reviewed, Patient's Cardiovascular Status Stable, Respiratory Function Stable, Patent Airway and Pain level controlled  Last Vitals:  Filed Vitals:   12/08/13 1155  BP:   Pulse:   Temp: 36.5 C  Resp:     Post vital signs: Reviewed and stable  Level of consciousness: awake, alert  and oriented  Complications: No apparent anesthesia complications

## 2013-12-08 NOTE — Transfer of Care (Signed)
Immediate Anesthesia Transfer of Care Note  Patient: Courtney Clements  Procedure(s) Performed: Procedure(s): LAPAROSCOPIC UMBILICAL HERNIA (N/A) INSERTION OF MESH (N/A)  Patient Location: PACU  Anesthesia Type:General  Level of Consciousness: awake, alert  and oriented  Airway & Oxygen Therapy: Patient Spontanous Breathing and Patient connected to nasal cannula oxygen  Post-op Assessment: Report given to PACU RN and Post -op Vital signs reviewed and stable  Post vital signs: Reviewed and stable  Complications: No apparent anesthesia complications

## 2013-12-08 NOTE — Interval H&P Note (Signed)
History and Physical Interval Note:  12/08/2013 10:36 AM  Courtney Clements  has presented today for surgery, with the diagnosis of umbilical hernia   The various methods of treatment have been discussed with the patient and family. After consideration of risks, benefits and other options for treatment, the patient has consented to  Procedure(s): LAPAROSCOPIC UMBILICAL HERNIA (N/A) INSERTION OF MESH (N/A) as a surgical intervention .  The patient's history has been reviewed, patient examined, no change in status, stable for surgery.  I have reviewed the patient's chart and labs.  Questions were answered to the patient's satisfaction.     Axel Filler

## 2013-12-11 ENCOUNTER — Encounter (HOSPITAL_COMMUNITY): Payer: Self-pay | Admitting: General Surgery

## 2013-12-11 ENCOUNTER — Other Ambulatory Visit (INDEPENDENT_AMBULATORY_CARE_PROVIDER_SITE_OTHER): Payer: Self-pay | Admitting: General Surgery

## 2013-12-12 ENCOUNTER — Telehealth (INDEPENDENT_AMBULATORY_CARE_PROVIDER_SITE_OTHER): Payer: Self-pay | Admitting: Surgery

## 2013-12-12 NOTE — Telephone Encounter (Signed)
Courtney Clements had an umbilical hernia repair by Dr. Derrell Lolling on 12/08/2013. She has run out of pain meds.  She thought the pain meds could be called in or sent by Epic. She is going to try to wait until Monday.  Otherwise, she may go to an Urgent Care center for meds.  Ovidio Kin, MD, [facility] Pager: [phone] Office phone:  [phone]

## 2013-12-14 ENCOUNTER — Telehealth (INDEPENDENT_AMBULATORY_CARE_PROVIDER_SITE_OTHER): Payer: Self-pay

## 2013-12-14 DIAGNOSIS — K429 Umbilical hernia without obstruction or gangrene: Secondary | ICD-10-CM

## 2013-12-14 MED ORDER — OXYCODONE-ACETAMINOPHEN 5-325 MG PO TABS: 1.0000 | ORAL_TABLET | ORAL | Status: DC | PRN

## 2013-12-14 NOTE — Telephone Encounter (Signed)
Spoke with pt and informed her that her Rx is ready for pickup at our front desk. Refill of percocet 5/325 #30 Q4H PRN no refill.

## 2013-12-14 NOTE — Telephone Encounter (Signed)
Patient states she took Ibuprofen for pain over the weekend, but it did not touch the pain. She is having a migraine along with abdomen pain . Rates 9 . Denies constipation . Advised to if authorized it will be this afternoon before she can pick up the Rx . Patient verbalized understanding

## 2013-12-14 NOTE — Addendum Note (Signed)
Addended byIgnacia Marvel on: 12/14/2013 03:02 PM   Modules accepted: Orders

## 2013-12-28 ENCOUNTER — Encounter (INDEPENDENT_AMBULATORY_CARE_PROVIDER_SITE_OTHER): Payer: Self-pay | Admitting: General Surgery

## 2013-12-28 ENCOUNTER — Ambulatory Visit (INDEPENDENT_AMBULATORY_CARE_PROVIDER_SITE_OTHER): Payer: BC Managed Care – PPO | Admitting: General Surgery

## 2013-12-28 VITALS — BP 106/74 | HR 80 | Temp 98.2°F | Resp 16 | Ht 65.0 in | Wt 191.0 lb

## 2013-12-28 DIAGNOSIS — Z9889 Other specified postprocedural states: Secondary | ICD-10-CM

## 2013-12-28 NOTE — Progress Notes (Signed)
Patient ID: Courtney RotaDawn C Clements, female   DOB: 06/16/1971, 43 y.o.   MRN: [id number] Post op course The patient is a 43 year old female status post laparoscopic umbilical hernia repair with Mesh. Patient was doing well aside from some left lateral abdominal pain. She states this pain arising while contracting her abdominal muscles.  The patient has been stayign away from any heavy lifting.  On Exam: Was a clean dry and intact  Assessment and Plan 43 year old female status post laparoscopic umbilical hernia repair with mesh 1. We discussed with her strictures or 3 weeks 2. Patient follow up as needed   Axel FillerArmando Ramirez, MD [facility] Surgery Trauma & Emergency Surgery

## 2014-03-12 ENCOUNTER — Encounter: Payer: Self-pay | Admitting: Women's Health

## 2014-03-12 ENCOUNTER — Ambulatory Visit (INDEPENDENT_AMBULATORY_CARE_PROVIDER_SITE_OTHER): Payer: BC Managed Care – PPO | Admitting: Women's Health

## 2014-03-12 ENCOUNTER — Other Ambulatory Visit (HOSPITAL_COMMUNITY)
Admission: RE | Admit: 2014-03-12 | Discharge: 2014-03-12 | Disposition: A | Discharge status: Home / Self Care | Payer: BC Managed Care – PPO | Source: Ambulatory Visit | Attending: Women's Health | Admitting: Women's Health

## 2014-03-12 VITALS — BP 117/75 | Ht 65.5 in | Wt 196.0 lb

## 2014-03-12 DIAGNOSIS — N938 Other specified abnormal uterine and vaginal bleeding: Secondary | ICD-10-CM

## 2014-03-12 DIAGNOSIS — Z13220 Encounter for screening for lipoid disorders: Secondary | ICD-10-CM

## 2014-03-12 DIAGNOSIS — Z01419 Encounter for gynecological examination (general) (routine) without abnormal findings: Secondary | ICD-10-CM

## 2014-03-12 DIAGNOSIS — E079 Disorder of thyroid, unspecified: Secondary | ICD-10-CM

## 2014-03-12 DIAGNOSIS — Z1151 Encounter for screening for human papillomavirus (HPV): Secondary | ICD-10-CM | POA: Insufficient documentation

## 2014-03-12 DIAGNOSIS — N949 Unspecified condition associated with female genital organs and menstrual cycle: Secondary | ICD-10-CM

## 2014-03-12 DIAGNOSIS — Z833 Family history of diabetes mellitus: Secondary | ICD-10-CM

## 2014-03-12 DIAGNOSIS — N925 Other specified irregular menstruation: Secondary | ICD-10-CM

## 2014-03-12 LAB — CBC WITH DIFFERENTIAL/PLATELET
Basophils Absolute: 0 10*3/uL (ref 0.0–0.1)
Basophils Relative: 0 % (ref 0–1)
EOS ABS: 0.2 10*3/uL (ref 0.0–0.7)
Eosinophils Relative: 2 % (ref 0–5)
HCT: 43.8 % (ref 36.0–46.0)
HEMOGLOBIN: 15 g/dL (ref 12.0–15.0)
Lymphocytes Relative: 19 % (ref 12–46)
Lymphs Abs: 1.6 10*3/uL (ref 0.7–4.0)
MCH: 31.2 pg (ref 26.0–34.0)
MCHC: 34.2 g/dL (ref 30.0–36.0)
MCV: 91.1 fL (ref 78.0–100.0)
MONOS PCT: 6 % (ref 3–12)
Monocytes Absolute: 0.5 10*3/uL (ref 0.1–1.0)
NEUTROS PCT: 73 % (ref 43–77)
Neutro Abs: 6.1 10*3/uL (ref 1.7–7.7)
Platelets: 258 10*3/uL (ref 150–400)
RBC: 4.81 MIL/uL (ref 3.87–5.11)
RDW: 13.2 % (ref 11.5–15.5)
WBC: 8.3 10*3/uL (ref 4.0–10.5)

## 2014-03-12 LAB — GLUCOSE, RANDOM: Glucose, Bld: 87 mg/dL (ref 70–99)

## 2014-03-12 LAB — TSH: TSH: 0.986 u[IU]/mL (ref 0.350–4.500)

## 2014-03-12 LAB — LIPID PANEL
CHOL/HDL RATIO: 3.1 ratio
Cholesterol: 173 mg/dL (ref 0–200)
HDL: 56 mg/dL (ref >39)
LDL Cholesterol: 101 mg/dL — ABNORMAL HIGH (ref 0–99)
Triglycerides: 79 mg/dL (ref <150)
VLDL: 16 mg/dL (ref 0–40)

## 2014-03-12 NOTE — Progress Notes (Signed)
Courtney Clements [birth year] [id number]    History:    Presents for annual exam.  New patient with reported history of an abnormal Pap that with recheck was okay. Has not had a mammogram. Umbilical hernia repair 12/2013. Having irregular bleeding, spotting especially with intercourse the past 4 months. BTL/same partner.  Past medical history, past surgical history, family history and social history were all reviewed and documented in the EPIC chart. Artist, paints furniture. 4 children ages 52, 31, 79 and 23.  ROS:  A  12 point ROS was performed and pertinent positives and negatives are included.  Exam:  Filed Vitals:   03/12/14 1051  BP: 117/75    General appearance:  Normal Thyroid:  Symmetrical, normal in size, without palpable masses or nodularity. Respiratory  Auscultation:  Clear without wheezing or rhonchi Cardiovascular  Auscultation:  Regular rate, without rubs, murmurs or gallops  Edema/varicosities:  Not grossly evident Abdominal  Soft,nontender, without masses, guarding or rebound.  Liver/spleen:  No organomegaly noted  Hernia:  None appreciated  Skin  Inspection:  Grossly normal   Breasts: Examined lying and sitting.     Right: Without masses, retractions, discharge or axillary adenopathy.     Left: Without masses, retractions, discharge or axillary adenopathy. Gentitourinary   Inguinal/mons:  Normal without inguinal adenopathy  External genitalia:  Normal  BUS/Urethra/Skene's glands:  Normal  Vagina:  Normal  Cervix:  Friable, no visible discharge  Uterus:   normal in size, shape and contour.  Midline and mobile  Adnexa/parametria:     Rt: Without masses or tenderness.   Lt: Without masses or tenderness.  Anus and perineum: Normal  Digital rectal exam: Normal sphincter tone without palpated masses or tenderness  Assessment/Plan:  43 y.o.MWF G5P4  for annual exam.    DUB x4 months/BTL History of an abnormal Pap with no treatment needed with pap rechecks 12/2013  umbilical hernia repair  Plan: Sonohysterogram with Dr. Lily Peer, instructed to schedule. SBE's, screening annual mammogram encouraged, instructed to schedule. Reviewed importance of regular exercise, calcium rich diet, vitamin D 1000 daily. CBC, TSH, glucose, lipid panel, UA, Pap with HR HPV typing.  Note: This dictation was prepared with Dragon/digital dictation.  Any transcriptional errors that result are unintentional. Harrington [facility], 12:46 PM 03/12/2014

## 2014-03-13 LAB — URINALYSIS W MICROSCOPIC + REFLEX CULTURE
BACTERIA UA: NONE SEEN
Bilirubin Urine: NEGATIVE
CASTS: NONE SEEN
Crystals: NONE SEEN
Glucose, UA: NEGATIVE mg/dL
Hgb urine dipstick: NEGATIVE
KETONES UR: NEGATIVE mg/dL
LEUKOCYTES UA: NEGATIVE
Nitrite: NEGATIVE
Protein, ur: NEGATIVE mg/dL
Specific Gravity, Urine: >1.03 — ABNORMAL HIGH (ref 1.005–1.030)
Urobilinogen, UA: 0.2 mg/dL (ref 0.0–1.0)
pH: 5.5 (ref 5.0–8.0)

## 2014-03-15 ENCOUNTER — Encounter: Payer: Self-pay | Admitting: Women's Health

## 2014-03-16 LAB — CYTOLOGY - PAP

## 2014-03-23 ENCOUNTER — Other Ambulatory Visit: Payer: Self-pay | Admitting: Gynecology

## 2014-03-23 DIAGNOSIS — N938 Other specified abnormal uterine and vaginal bleeding: Secondary | ICD-10-CM

## 2014-03-26 ENCOUNTER — Other Ambulatory Visit: Payer: Self-pay | Admitting: Gynecology

## 2014-03-26 ENCOUNTER — Telehealth: Payer: Self-pay

## 2014-03-26 ENCOUNTER — Ambulatory Visit (INDEPENDENT_AMBULATORY_CARE_PROVIDER_SITE_OTHER): Payer: BC Managed Care – PPO | Admitting: Gynecology

## 2014-03-26 ENCOUNTER — Ambulatory Visit (INDEPENDENT_AMBULATORY_CARE_PROVIDER_SITE_OTHER): Payer: BC Managed Care – PPO

## 2014-03-26 DIAGNOSIS — N938 Other specified abnormal uterine and vaginal bleeding: Secondary | ICD-10-CM

## 2014-03-26 DIAGNOSIS — N930 Postcoital and contact bleeding: Secondary | ICD-10-CM

## 2014-03-26 DIAGNOSIS — N949 Unspecified condition associated with female genital organs and menstrual cycle: Secondary | ICD-10-CM

## 2014-03-26 DIAGNOSIS — N854 Malposition of uterus: Secondary | ICD-10-CM

## 2014-03-26 DIAGNOSIS — N8300 Follicular cyst of ovary, unspecified side: Secondary | ICD-10-CM

## 2014-03-26 DIAGNOSIS — N920 Excessive and frequent menstruation with regular cycle: Secondary | ICD-10-CM

## 2014-03-26 DIAGNOSIS — N925 Other specified irregular menstruation: Secondary | ICD-10-CM

## 2014-03-26 DIAGNOSIS — Z23 Encounter for immunization: Secondary | ICD-10-CM

## 2014-03-26 NOTE — Progress Notes (Signed)
   Patient is a 43 year old with previous tubal sterilization procedure presented to the office today as part of her evaluation for her dysfunctional uterine bleeding. She was seen by our nurse practitioner September 4 and she had mentioned that for the past 4 months she has been having bleeding between cycles and postcoital bleeding. She denies any past history of abnormal Pap smears. Pap smear this year was normal.  The patient recently had CBC, fasting lipid profile, comprehensive metabolic panel, TSH, and urinalysis which were all normal as well.  Patient was counseled for sonohysterogram and endometrial biopsy today.  Ultrasound: Uterus measuring 10.4 x 6.0 x 4.9 cm with endometrial stripe of 10.6 mm. Ovaries normal with follicles noted. No fluid in the cul-de-sac. After cleansing the cervix with Betadine solution a sterile catheter was introduced into the uterine cavity and normal saline was instilled no endometrial cavity abnormalities were noted.  Following this a sterile Pipelle was introduced into the uterine cavity. The uterus sounded to 7 cm. Endometrial biopsy was obtained and tissue submitted for histological evaluation.  Assessment/plan: Patient with dysfunctional uterine bleeding. The patient had been on oral contraceptive pills many years ago. She did inform me today that her father has been diagnosed with DVT several years ago.  Options discussed for which patient will decide: #1 low dose oral contraceptive pill but we need to do a full thrombophilia panel prior to prescribing #2 Mirena IUD #3 option endometrial ablation.  Patient received information on all the above and will let us know next week. We'll notify her with the results of endometrial biopsy. Patient received the flu vaccine today.

## 2014-03-26 NOTE — Telephone Encounter (Signed)
Dr. Glenetta Hew asked me to check ins benefits for Her Option ablation and Mirena IUD. I did this and notified patient. Patient said Dr. Glenetta Hew indicated she will need to wait for results from [facility] today and then can decide how she wants to proceed.

## 2014-03-26 NOTE — Addendum Note (Signed)
Addended by: Berna Spare A on: 03/26/2014 10:53 AM   Modules accepted: Orders

## 2014-03-26 NOTE — Patient Instructions (Signed)
Levonorgestrel intrauterine device (IUD) What is this medicine? LEVONORGESTREL IUD (LEE voe nor jes trel) is a contraceptive (birth control) device. The device is placed inside the uterus by a healthcare professional. It is used to prevent pregnancy and can also be used to treat heavy bleeding that occurs during your period. Depending on the device, it can be used for 3 to 5 years. This medicine may be used for other purposes; ask your health care provider or pharmacist if you have questions. COMMON BRAND NAME(S): LILETTA, Mirena, Skyla What should I tell my health care provider before I take this medicine? They need to know if you have any of these conditions: -abnormal Pap smear -cancer of the breast, uterus, or cervix -diabetes -endometritis -genital or pelvic infection now or in the past -have more than one sexual partner or your partner has more than one partner -heart disease -history of an ectopic or tubal pregnancy -immune system problems -IUD in place -liver disease or tumor -problems with blood clots or take blood-thinners -use intravenous drugs -uterus of unusual shape -vaginal bleeding that has not been explained -an unusual or allergic reaction to levonorgestrel, other hormones, silicone, or polyethylene, medicines, foods, dyes, or preservatives -pregnant or trying to get pregnant -breast-feeding How should I use this medicine? This device is placed inside the uterus by a health care professional. Talk to your pediatrician regarding the use of this medicine in children. Special care may be needed. Overdosage: If you think you have taken too much of this medicine contact a poison control center or emergency room at once. NOTE: This medicine is only for you. Do not share this medicine with others. What if I miss a dose? This does not apply. What may interact with this medicine? Do not take this medicine with any of the following  medications: -amprenavir -bosentan -fosamprenavir This medicine may also interact with the following medications: -aprepitant -barbiturate medicines for inducing sleep or treating seizures -bexarotene -griseofulvin -medicines to treat seizures like carbamazepine, ethotoin, felbamate, oxcarbazepine, phenytoin, topiramate -modafinil -pioglitazone -rifabutin -rifampin -rifapentine -some medicines to treat HIV infection like atazanavir, indinavir, lopinavir, nelfinavir, tipranavir, ritonavir -St. John's wort -warfarin This list may not describe all possible interactions. Give your health care provider a list of all the medicines, herbs, non-prescription drugs, or dietary supplements you use. Also tell them if you smoke, drink alcohol, or use illegal drugs. Some items may interact with your medicine. What should I watch for while using this medicine? Visit your doctor or health care professional for regular check ups. See your doctor if you or your partner has sexual contact with others, becomes HIV positive, or gets a sexual transmitted disease. This product does not protect you against HIV infection (AIDS) or other sexually transmitted diseases. You can check the placement of the IUD yourself by reaching up to the top of your vagina with clean fingers to feel the threads. Do not pull on the threads. It is a good habit to check placement after each menstrual period. Call your doctor right away if you feel more of the IUD than just the threads or if you cannot feel the threads at all. The IUD may come out by itself. You may become pregnant if the device comes out. If you notice that the IUD has come out use a backup birth control method like condoms and call your health care provider. Using tampons will not change the position of the IUD and are okay to use during your period. What side effects may   I notice from receiving this medicine? Side effects that you should report to your doctor or  health care professional as soon as possible: -allergic reactions like skin rash, itching or hives, swelling of the face, lips, or tongue -fever, flu-like symptoms -genital sores -high blood pressure -no menstrual period for 6 weeks during use -pain, swelling, warmth in the leg -pelvic pain or tenderness -severe or sudden headache -signs of pregnancy -stomach cramping -sudden shortness of breath -trouble with balance, talking, or walking -unusual vaginal bleeding, discharge -yellowing of the eyes or skin Side effects that usually do not require medical attention (report to your doctor or health care professional if they continue or are bothersome): -acne -breast pain -change in sex drive or performance -changes in weight -cramping, dizziness, or faintness while the device is being inserted -headache -irregular menstrual bleeding within first 3 to 6 months of use -nausea This list may not describe all possible side effects. Call your doctor for medical advice about side effects. You may report side effects to FDA at 1-800-FDA-1088. Where should I keep my medicine? This does not apply. NOTE: This sheet is a summary. It may not cover all possible information. If you have questions about this medicine, talk to your doctor, pharmacist, or health care provider.  2015, Elsevier/Gold Standard. (2011-07-26 13:54:04) Endometrial Ablation Endometrial ablation removes the lining of the uterus (endometrium). It is usually a same-day, outpatient treatment. Ablation helps avoid major surgery, such as surgery to remove the cervix and uterus (hysterectomy). After endometrial ablation, you will have little or no menstrual bleeding and may not be able to have children. However, if you are premenopausal, you will need to use a reliable method of birth control following the procedure because of the small chance that pregnancy can occur. There are different reasons to have this procedure, which  include:  Heavy periods.  Bleeding that is causing anemia.  Irregular bleeding.  Bleeding fibroids on the lining inside the uterus if they are smaller than 3 centimeters. This procedure should not be done if:  You want children in the future.  You have severe cramps with your menstrual period.  You have precancerous or cancerous cells in your uterus.  You were recently pregnant.  You have gone through menopause.  You have had major surgery on the uterus, such as a cesarean delivery. LET [facility] CARE PROVIDER KNOW ABOUT:  Any allergies you have.  All medicines you are taking, including vitamins, herbs, eye drops, creams, and over-the-counter medicines.  Previous problems you or members of your family have had with the use of anesthetics.  Any blood disorders you have.  Previous surgeries you have had.  Medical conditions you have. RISKS AND COMPLICATIONS  Generally, this is a safe procedure. However, as with any procedure, complications can occur. Possible complications include:  Perforation of the uterus.  Bleeding.  Infection of the uterus, bladder, or vagina.  Injury to surrounding organs.  An air bubble to the lung (air embolus).  Pregnancy following the procedure.  Failure of the procedure to help the problem, requiring hysterectomy.  Decreased ability to diagnose cancer in the lining of the uterus. BEFORE THE PROCEDURE  The lining of the uterus must be tested to make sure there is no pre-cancerous or cancer cells present.  An ultrasound may be performed to look at the size of the uterus and to check for abnormalities.  Medicines may be given to thin the lining of the uterus. PROCEDURE  During the procedure, your health  care provider will use a tool called a resectoscope to help see inside your uterus. There are different ways to remove the lining of your uterus.   Radiofrequency - This method uses a radiofrequency-alternating electric current to  remove the lining of the uterus.  Cryotherapy - This method uses extreme cold to freeze the lining of the uterus.  Heated-Free Liquid - This method uses heated salt (saline) solution to remove the lining of the uterus.  Microwave - This method uses high-energy microwaves to heat up the lining of the uterus to remove it.  Thermal balloon - This method involves inserting a catheter with a balloon tip into the uterus. The balloon tip is filled with heated fluid to remove the lining of the uterus. AFTER THE PROCEDURE  After your procedure, do not have sexual intercourse or insert anything into your vagina until permitted by your health care provider. After the procedure, you may experience:  Cramps.  Vaginal discharge.  Frequent urination. Document Released: 05/04/2004 Document Revised: 02/25/2013 Document Reviewed: 11/26/2012 [employer] Patient Information 2015 [place], [place]. This information is not intended to replace advice given to you by your health care provider. Make sure you discuss any questions you have with your health care provider. Influenza Virus Vaccine injection (Fluarix) What is this medicine? INFLUENZA VIRUS VACCINE (in floo EN zuh VAHY ruhs vak SEEN) helps to reduce the risk of getting influenza also known as the flu. This medicine may be used for other purposes; ask your health care provider or pharmacist if you have questions. COMMON BRAND NAME(S): Fluarix, Fluzone What should I tell my health care provider before I take this medicine? They need to know if you have any of these conditions: -bleeding disorder like hemophilia -fever or infection -Guillain-Barre syndrome or other neurological problems -immune system problems -infection with the human immunodeficiency virus (HIV) or AIDS -low blood platelet counts -multiple sclerosis -an unusual or allergic reaction to influenza virus vaccine, eggs, chicken proteins, latex, gentamicin, other medicines, foods, dyes or  preservatives -pregnant or trying to get pregnant -breast-feeding How should I use this medicine? This vaccine is for injection into a muscle. It is given by a health care professional. A copy of Vaccine Information Statements will be given before each vaccination. Read this sheet carefully each time. The sheet may change frequently. Talk to your pediatrician regarding the use of this medicine in children. Special care may be needed. Overdosage: If you think you have taken too much of this medicine contact a poison control center or emergency room at once. NOTE: This medicine is only for you. Do not share this medicine with others. What if I miss a dose? This does not apply. What may interact with this medicine? -chemotherapy or radiation therapy -medicines that lower your immune system like etanercept, anakinra, infliximab, and adalimumab -medicines that treat or prevent blood clots like warfarin -phenytoin -steroid medicines like prednisone or cortisone -theophylline -vaccines This list may not describe all possible interactions. Give your health care provider a list of all the medicines, herbs, non-prescription drugs, or dietary supplements you use. Also tell them if you smoke, drink alcohol, or use illegal drugs. Some items may interact with your medicine. What should I watch for while using this medicine? Report any side effects that do not go away within 3 days to your doctor or health care professional. Call your health care provider if any unusual symptoms occur within 6 weeks of receiving this vaccine. You may still catch the flu, but the illness  is not usually as bad. You cannot get the flu from the vaccine. The vaccine will not protect against colds or other illnesses that may cause fever. The vaccine is needed every year. What side effects may I notice from receiving this medicine? Side effects that you should report to your doctor or health care professional as soon as  possible: -allergic reactions like skin rash, itching or hives, swelling of the face, lips, or tongue Side effects that usually do not require medical attention (report to your doctor or health care professional if they continue or are bothersome): -fever -headache -muscle aches and pains -pain, tenderness, redness, or swelling at site where injected -weak or tired This list may not describe all possible side effects. Call your doctor for medical advice about side effects. You may report side effects to FDA at 1-800-FDA-1088. Where should I keep my medicine? This vaccine is only given in a clinic, pharmacy, doctor's office, or other health care setting and will not be stored at home. NOTE: This sheet is a summary. It may not cover all possible information. If you have questions about this medicine, talk to your doctor, pharmacist, or health care provider.  2015, Elsevier/Gold Standard. (2008-01-21 09:30:40)

## 2014-04-05 ENCOUNTER — Other Ambulatory Visit: Payer: Self-pay | Admitting: Gynecology

## 2014-04-05 DIAGNOSIS — N921 Excessive and frequent menstruation with irregular cycle: Secondary | ICD-10-CM

## 2014-04-09 ENCOUNTER — Other Ambulatory Visit: Payer: Self-pay | Admitting: Gynecology

## 2014-04-09 MED ORDER — PROGESTERONE MICRONIZED 200 MG PO CAPS: 200.0000 mg | ORAL_CAPSULE | Freq: Every day | ORAL | Status: DC

## 2014-04-16 ENCOUNTER — Telehealth: Payer: Self-pay

## 2014-04-16 NOTE — Telephone Encounter (Signed)
Patient called me on 04/09/14 to let me know her menses began that day. She had decided earlier that she wanted to proceed with Her Option Ablation and I am holding time for her on Oct 16 9:00am.  We confirmed that this will work out with her period.  I instructed her regarding need for someone to drive her to and from procedure and need for full bladder morning of procedure. I mailed her full bladder instructions as well.  I instructed her regarding Prometrium 200 mg beginning Day 3 of menses until procedure date and I sent Rx.   Pre Op Consult appt scheduled for Thursday Oct 15 4:30pm.

## 2014-04-19 NOTE — Telephone Encounter (Signed)
Great.  Thanks

## 2014-04-22 ENCOUNTER — Ambulatory Visit (INDEPENDENT_AMBULATORY_CARE_PROVIDER_SITE_OTHER): Payer: BC Managed Care – PPO | Admitting: Gynecology

## 2014-04-22 ENCOUNTER — Encounter: Payer: Self-pay | Admitting: Gynecology

## 2014-04-22 VITALS — BP 126/80 | Ht 66.0 in | Wt 196.0 lb

## 2014-04-22 DIAGNOSIS — N938 Other specified abnormal uterine and vaginal bleeding: Secondary | ICD-10-CM

## 2014-04-22 DIAGNOSIS — Z01818 Encounter for other preprocedural examination: Secondary | ICD-10-CM

## 2014-04-22 MED ORDER — AZITHROMYCIN 500 MG PO TABS: ORAL_TABLET | ORAL | Status: DC

## 2014-04-22 MED ORDER — DIAZEPAM 5 MG PO TABS: ORAL_TABLET | ORAL | Status: DC

## 2014-04-22 MED ORDER — MISOPROSTOL 200 MCG PO TABS: ORAL_TABLET | ORAL | Status: DC

## 2014-04-22 MED ORDER — HYDROCODONE-ACETAMINOPHEN 5-325 MG PO TABS: ORAL_TABLET | ORAL | Status: DC

## 2014-04-22 NOTE — Progress Notes (Signed)
   Patient presented to the office today for preoperative exam and consultation for a planned her option endometrial ablation tomorrow morning. Patient history as follows:  43 year old with previous tubal sterilization procedure presented to the office today as part of her evaluation for her dysfunctional uterine bleeding. She was seen by our nurse practitioner September 4 and she had mentioned that for the past 4 months she has been having bleeding between cycles and postcoital bleeding. She denies any past history of abnormal Pap smears. Pap smear this year was normal.  The patient recently had CBC, fasting lipid profile, comprehensive metabolic panel, TSH, and urinalysis which were all normal as well.  Patient had a sonohysterogram and endometrial biopsy on 03/26/2014 with following result: Ultrasound:  Uterus measuring 10.4 x 6.0 x 4.9 cm with endometrial stripe of 10.6 mm. Ovaries normal with follicles noted. No fluid in the cul-de-sac. After cleansing the cervix with Betadine solution a sterile catheter was introduced into the uterine cavity and normal saline was instilled no endometrial cavity abnormalities were noted   Endometrial biopsy pathology report: Diagnosis Endometrium, biopsy - BENIGN PROLIFERATIVE-TYPE ENDOMETRIUM. - THERE IS NO EVIDENCE OF HYPERPLASIA OR MALIGNANCY.  Exam: HEENT unremarkable Lungs clear to auscultation Rogers or wheezes Heart regular rate rhythm no murmurs or gallops Abdomen: Soft nontender no rebound or guarding Pelvic exam: Bartholin urethra Skene was within normal limits Vagina: No lesions or discharge Uterus upper limits of normal slightly retroverted nontender Adnexa: No palpable masses or tenderness Rectal exam: Not done  Assessment/plan: Patient with menorrhagia and dysmenorrhea will be scheduled for endometrial ablation via her option technique tomorrow. Patient was prescribe Cytotec 200 mcg to take at 7 PM tonight for cervical ripening. She  was prescribed Zithromax 500 mg her to take the morning of the procedure and and to repeat 24 hours later. She was also prescribed Vicodin 5-325 mg she can take one before bedtime tonight then taken on the morning of the procedure then every 6 hours when necessary. For anxiety she was given a prescription of Valium 5 mg to take 1 by mouth twice a day when necessary but to make sure that there is at least 6 hour interval between the Valium and Percocet with instructions provided. Patient read through the consent form and all questions were answered. We discussed the risk of infection, bleeding, perforation of the uterus. We also discussed that if this does not resolve her symptoms in the near future she could need a hysterectomy. Also that premenopausal patient's may need to have this procedure repeated in 6 years. Patient fully understands and accepts and we will proceed with planned tomorrow.

## 2014-04-23 ENCOUNTER — Ambulatory Visit (INDEPENDENT_AMBULATORY_CARE_PROVIDER_SITE_OTHER): Payer: BC Managed Care – PPO

## 2014-04-23 ENCOUNTER — Ambulatory Visit (INDEPENDENT_AMBULATORY_CARE_PROVIDER_SITE_OTHER): Payer: BC Managed Care – PPO | Admitting: Gynecology

## 2014-04-23 ENCOUNTER — Encounter: Payer: Self-pay | Admitting: Gynecology

## 2014-04-23 VITALS — BP 116/72 | HR 72

## 2014-04-23 DIAGNOSIS — N921 Excessive and frequent menstruation with irregular cycle: Secondary | ICD-10-CM

## 2014-04-23 DIAGNOSIS — N946 Dysmenorrhea, unspecified: Secondary | ICD-10-CM

## 2014-04-23 DIAGNOSIS — N920 Excessive and frequent menstruation with regular cycle: Secondary | ICD-10-CM

## 2014-04-23 MED ORDER — KETOROLAC TROMETHAMINE 60 MG/2ML IM SOLN
60.0000 mg | Freq: Once | INTRAMUSCULAR | Status: AC
  Administered 2014-04-23: 60 mg via INTRAMUSCULAR

## 2014-04-23 NOTE — Patient Instructions (Signed)
Endometrial Ablation Endometrial ablation removes the lining of the uterus (endometrium). It is usually a same-day, outpatient treatment. Ablation helps avoid major surgery, such as surgery to remove the cervix and uterus (hysterectomy). After endometrial ablation, you will have little or no menstrual bleeding and may not be able to have children. However, if you are premenopausal, you will need to use a reliable method of birth control following the procedure because of the small chance that pregnancy can occur. There are different reasons to have this procedure, which include:  Heavy periods.  Bleeding that is causing anemia.  Irregular bleeding.  Bleeding fibroids on the lining inside the uterus if they are smaller than 3 centimeters. This procedure should not be done if:  You want children in the future.  You have severe cramps with your menstrual period.  You have precancerous or cancerous cells in your uterus.  You were recently pregnant.  You have gone through menopause.  You have had major surgery on the uterus, such as a cesarean delivery. LET YOUR HEALTH CARE PROVIDER KNOW ABOUT:  Any allergies you have.  All medicines you are taking, including vitamins, herbs, eye drops, creams, and over-the-counter medicines.  Previous problems you or members of your family have had with the use of anesthetics.  Any blood disorders you have.  Previous surgeries you have had.  Medical conditions you have. RISKS AND COMPLICATIONS  Generally, this is a safe procedure. However, as with any procedure, complications can occur. Possible complications include:  Perforation of the uterus.  Bleeding.  Infection of the uterus, bladder, or vagina.  Injury to surrounding organs.  An air bubble to the lung (air embolus).  Pregnancy following the procedure.  Failure of the procedure to help the problem, requiring hysterectomy.  Decreased ability to diagnose cancer in the lining of  the uterus. BEFORE THE PROCEDURE  The lining of the uterus must be tested to make sure there is no pre-cancerous or cancer cells present.  An ultrasound may be performed to look at the size of the uterus and to check for abnormalities.  Medicines may be given to thin the lining of the uterus. PROCEDURE  During the procedure, your health care provider will use a tool called a resectoscope to help see inside your uterus. There are different ways to remove the lining of your uterus.   Radiofrequency - This method uses a radiofrequency-alternating electric current to remove the lining of the uterus.  Cryotherapy - This method uses extreme cold to freeze the lining of the uterus.  Heated-Free Liquid - This method uses heated salt (saline) solution to remove the lining of the uterus.  Microwave - This method uses high-energy microwaves to heat up the lining of the uterus to remove it.  Thermal balloon - This method involves inserting a catheter with a balloon tip into the uterus. The balloon tip is filled with heated fluid to remove the lining of the uterus. AFTER THE PROCEDURE  After your procedure, do not have sexual intercourse or insert anything into your vagina until permitted by your health care provider. After the procedure, you may experience:  Cramps.  Vaginal discharge.  Frequent urination. Document Released: 05/04/2004 Document Revised: 02/25/2013 Document Reviewed: 11/26/2012 [employer] Patient Information 2015 [employer]. This information is not intended to replace advice given to you by your health care provider. Make sure you discuss any questions you have with your health care provider.  

## 2014-04-23 NOTE — Progress Notes (Signed)
   Patient presented to the office today for endometrial ablation via her option secondary to menorrhagia and dysmenorrhea. See previous note outlining her preprocedure workup. Procedure note:  Patient Name:Courtney Fatima BlankC Begin  Patient MRN: [id number]   Date:04/23/2014   Diagnosis:  Excessive Uterine Bleeding/Menorrhagia  Procedure:  Endometrial cryoablation with intraoperative ultrasonic guidance  Procedure Medications: Toradol 60 mg IM. Patient have it this morning along with her the office for   Procedure:  The Patient was brought to the treatment room having previously been counseled for the procedure position and a speculum was inserted.  The cervix and upper vagina were cleaned with Betadine.  A single tooth tenaculum was placed on the anterior lip of the cervix.  A paracervical block was placed per above.  The uterus was sounded to 7 cm.  Cervical dilation was not performed.  Under ultrasound guidance, the Her Option probe was introduced into the uterine cavity after the pre procedural sequence was performed.  After assuring proper cornual placement, Cryoablation was then performed under continuous ultrasound guidance monitoring the growth of the cryozone.  Sequential cryoablation were performed in the following order, locations, freeze times and post freeze myometrial depths.           Location of Freeze Length of Time Myometrial Depth 1. right uterine side   6 Minutes  7 mm 2. left uterine side    6 Minutes  9 mm  Upon completion of the procedure, the instruments were removed, hemostasis visualized and the patient was assisted to the bathroom and then another exam room where she was observed.  Vitals:   Pre treatment:  Time: 8:35 am  BP: 116/82  P: 76 Post Treatment:  Time: 9:10 AM  BP: 116/72  P: 72  The patient tolerated the procedure well and was released in stable condition with her driver along with a copy of the post procedure instruments which were reviewed with her.  She is  to return to the office in 2 weeks for a post procedural check.  [facility] Custer HospitalFERNANDEZ,Woodrow Dulski HMD9:37 AMTD@

## 2014-05-07 ENCOUNTER — Ambulatory Visit (INDEPENDENT_AMBULATORY_CARE_PROVIDER_SITE_OTHER): Payer: BC Managed Care – PPO | Admitting: Gynecology

## 2014-05-07 ENCOUNTER — Encounter: Payer: Self-pay | Admitting: Gynecology

## 2014-05-07 VITALS — BP 130/88

## 2014-05-07 DIAGNOSIS — Z09 Encounter for follow-up examination after completed treatment for conditions other than malignant neoplasm: Secondary | ICD-10-CM

## 2014-05-07 MED ORDER — TRAMADOL HCL 50 MG PO TABS: 50.0000 mg | ORAL_TABLET | Freq: Four times a day (QID) | ORAL | Status: DC | PRN

## 2014-05-07 NOTE — Progress Notes (Signed)
   Patient is a 43 year old who presented to the office for her two-week postop visit. Patient status post endometrial ablation via her option technique secondary to menorrhagia and dysmenorrhea. She was feeling somewhat lightheaded and call her primary physician who started her iron supplementation. Review of her labs indicated September 4 her hemoglobin and hematocrit were 15.0 and 43.8 with a platelet count 258,000. She has been bleeding on and off since her ablation was some clear fluid but it has resolved completely and patient is doing well today.  Exam: Abdomen: Soft nontender no rebound or guarding Pelvic: Bartholin urethra Skene was within normal limits Vagina: No lesions or discharge Cervix: No lesions or discharge Uterus: Anteverted normal size shape and consistency Adnexa: No palpable mass or tenderness Rectal exam: Not done  Assessment/plan: Patient 2 weeks status post endometrial ablation via her option technique doing well. Patient scheduled for mammogram the next few weeks. Patient has resumed full normal activity. She will resume intercourse. Patient to return ratio for annual exam or when necessary.

## 2015-09-09 IMAGING — CT CT ABD-PELV W/ CM
2 of 5 series · 4 of 46 positions shown, 5 images · IV contrast (Iodine)
Comparison: None.

CLINICAL DATA: Umbilical hernia with abdominal pain

EXAM:
CT ABDOMEN AND PELVIS WITH CONTRAST
TECHNIQUE: Multidetector CT imaging of the abdomen and pelvis was performed
using the standard protocol following bolus administration of
intravenous contrast.
CONTRAST:  100mL OMNIPAQUE IOHEXOL 300 MG/ML  SOLN

[Series 206: coronals · coronal · 0.50mm/px · 3 of 97 slices shown, 4 images]
[im 22/97  soft-tissue]
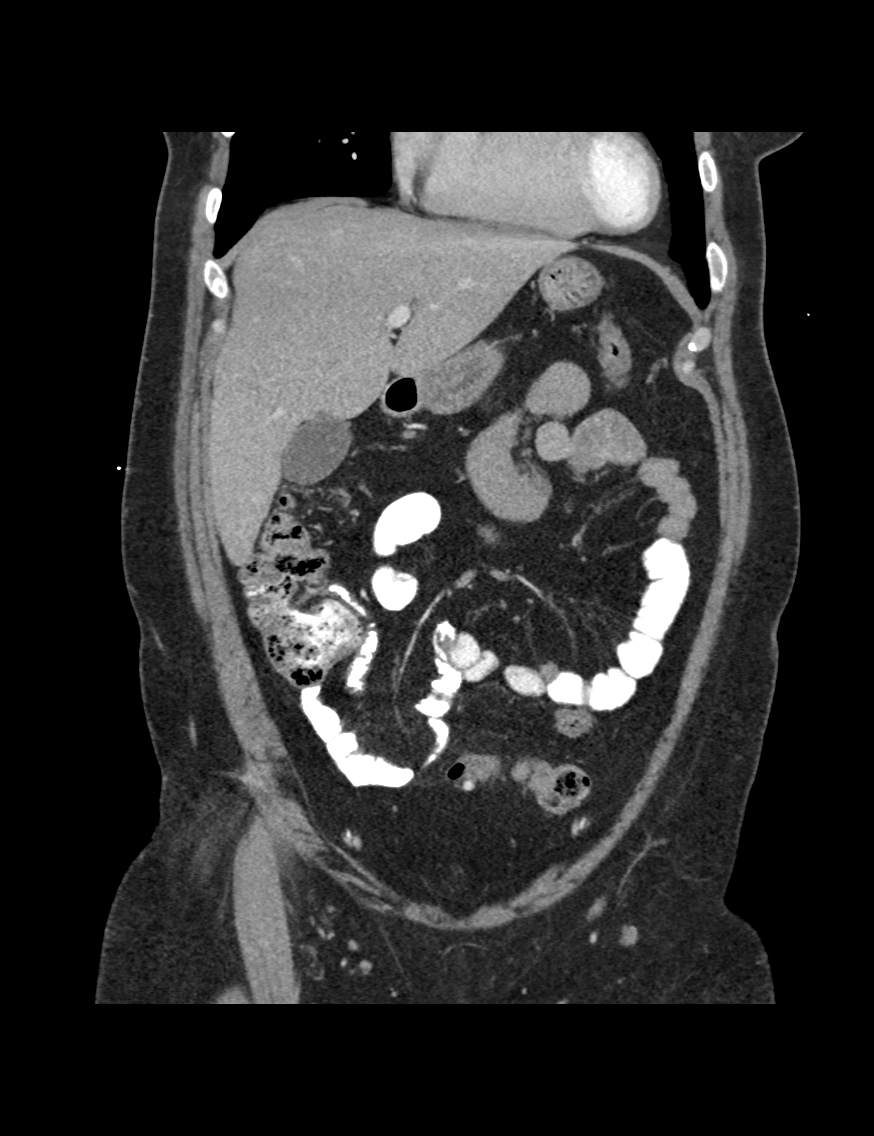
[im 22/97  bone]
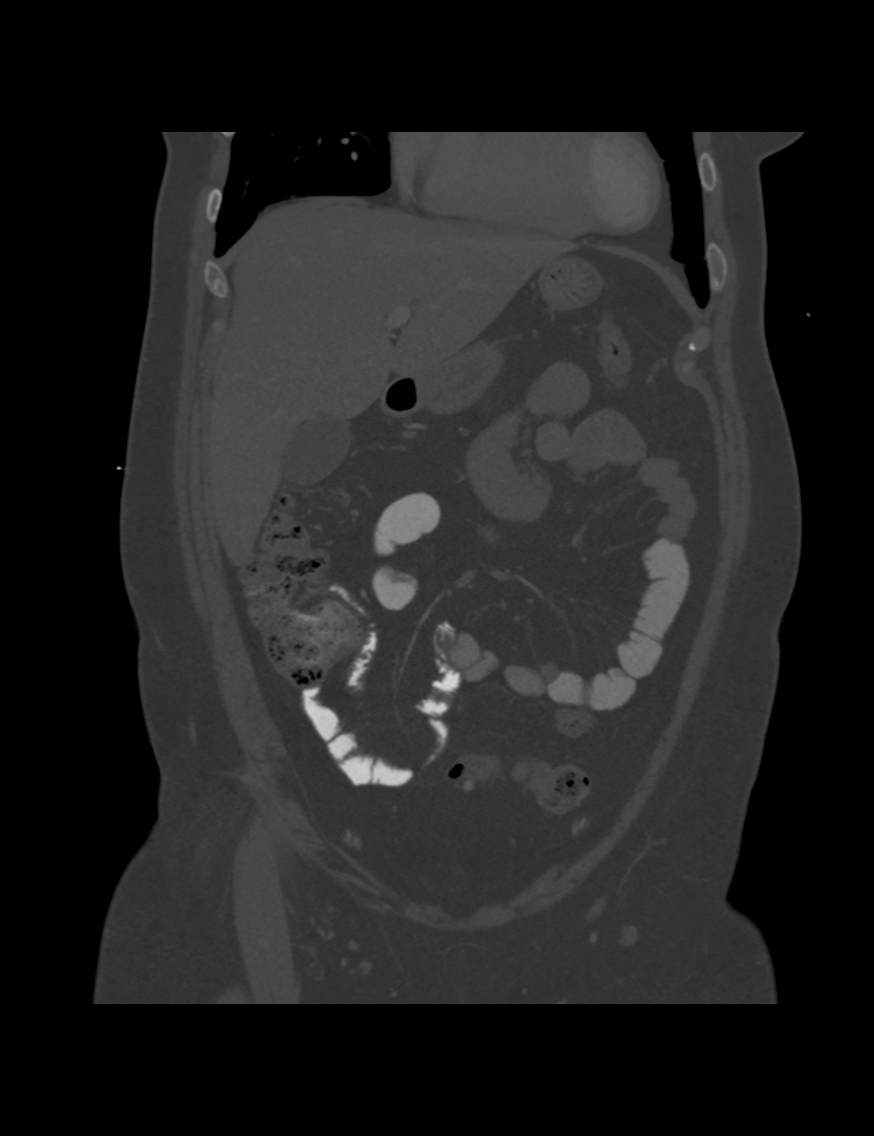
[im 54/97  soft-tissue]
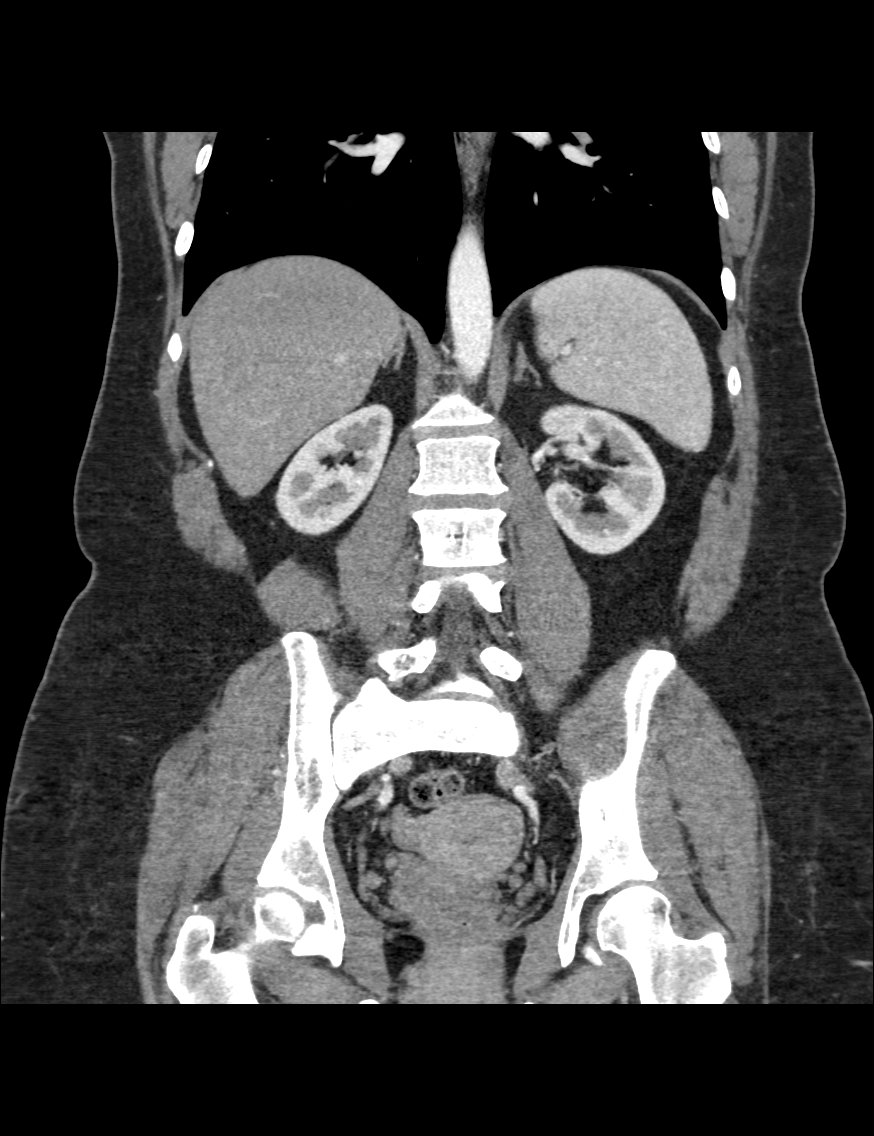
[im 75/97  soft-tissue]
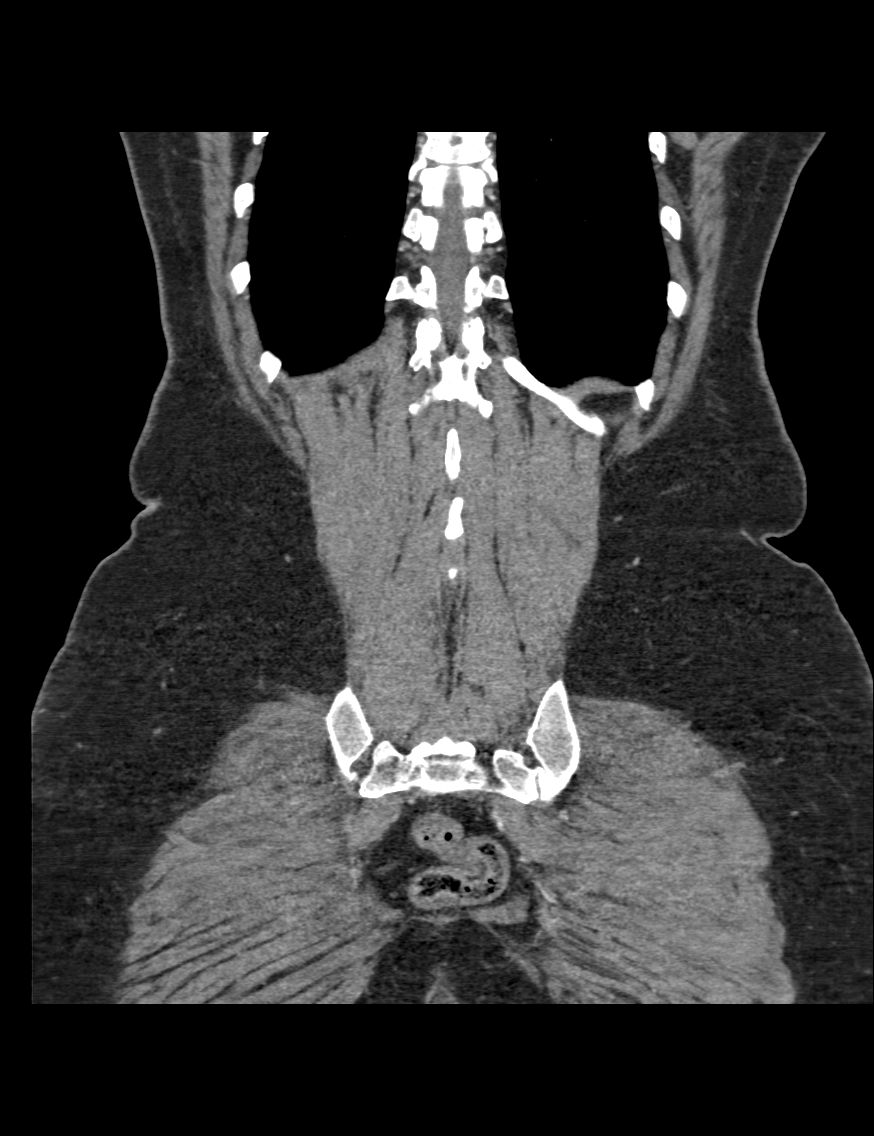

[Series 207: sagittals · sagittal · 0.50mm/px · 1 of 121 slices shown]
[im 41/121  soft-tissue]
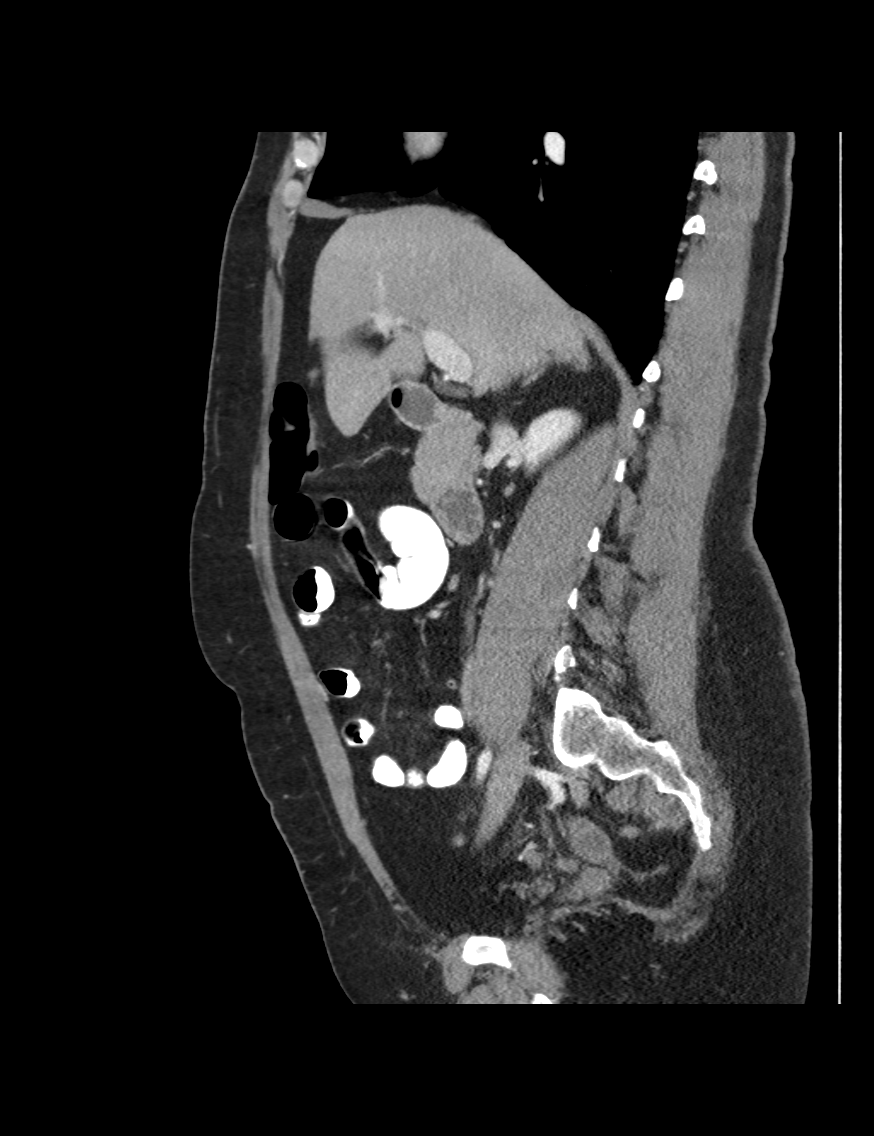

[4 of 46 positions shown; findings below may reference images not displayed]

FINDINGS: Lung bases are free of acute infiltrate or sizable effusion.

The liver is diffusely fatty infiltrated. The gallbladder, spleen,
adrenal glands and pancreas are within normal limits. The kidneys
are well visualized and demonstrate a normal enhancement pattern.
Normal excretion of contrast is noted. The appendix is well
visualized and within normal limits. The bladder is well distended.
No pelvic mass lesion is noted. Mild diverticular change of the
colon is noted without diverticulitis.

A small fat containing umbilical hernia is noted. No incarcerated
bowel loops are seen.
IMPRESSION: Small fat containing umbilical hernia. No other focal abnormality is
noted.

## 2016-03-27 ENCOUNTER — Emergency Department (HOSPITAL_COMMUNITY)
Admission: EM | Admit: 2016-03-27 | Discharge: 2016-03-27 | Disposition: A | Discharge status: Home / Self Care | Payer: Self-pay | Attending: Emergency Medicine | Admitting: Emergency Medicine

## 2016-03-27 ENCOUNTER — Encounter (HOSPITAL_COMMUNITY): Payer: Self-pay | Admitting: Emergency Medicine

## 2016-03-27 DIAGNOSIS — Z87891 Personal history of nicotine dependence: Secondary | ICD-10-CM | POA: Insufficient documentation

## 2016-03-27 DIAGNOSIS — Y929 Unspecified place or not applicable: Secondary | ICD-10-CM | POA: Insufficient documentation

## 2016-03-27 DIAGNOSIS — Y9389 Activity, other specified: Secondary | ICD-10-CM | POA: Insufficient documentation

## 2016-03-27 DIAGNOSIS — Z79899 Other long term (current) drug therapy: Secondary | ICD-10-CM | POA: Insufficient documentation

## 2016-03-27 DIAGNOSIS — M546 Pain in thoracic spine: Secondary | ICD-10-CM | POA: Insufficient documentation

## 2016-03-27 DIAGNOSIS — X58XXXA Exposure to other specified factors, initial encounter: Secondary | ICD-10-CM | POA: Insufficient documentation

## 2016-03-27 DIAGNOSIS — Z91040 Latex allergy status: Secondary | ICD-10-CM | POA: Insufficient documentation

## 2016-03-27 DIAGNOSIS — Y990 Civilian activity done for income or pay: Secondary | ICD-10-CM | POA: Insufficient documentation

## 2016-03-27 MED ORDER — DIAZEPAM 5 MG PO TABS: 5.0000 mg | ORAL_TABLET | Freq: Three times a day (TID) | ORAL | 0 refills | Status: DC | PRN

## 2016-03-27 MED ORDER — DIAZEPAM 5 MG PO TABS
5.0000 mg | ORAL_TABLET | Freq: Once | ORAL | Status: AC
  Administered 2016-03-27: 5 mg via ORAL
  Filled 2016-03-27: qty 1

## 2016-03-27 MED ORDER — OXYCODONE-ACETAMINOPHEN 5-325 MG PO TABS
1.0000 | ORAL_TABLET | Freq: Once | ORAL | Status: AC
  Administered 2016-03-27: 1 via ORAL
  Filled 2016-03-27: qty 1

## 2016-03-27 MED ORDER — IBUPROFEN 600 MG PO TABS: 600.0000 mg | ORAL_TABLET | Freq: Three times a day (TID) | ORAL | 0 refills | Status: DC | PRN

## 2016-03-27 MED ORDER — METHOCARBAMOL 500 MG PO TABS: 500.0000 mg | ORAL_TABLET | Freq: Three times a day (TID) | ORAL | 0 refills | Status: DC | PRN

## 2016-03-27 MED ORDER — KETOROLAC TROMETHAMINE 60 MG/2ML IM SOLN
60.0000 mg | Freq: Once | INTRAMUSCULAR | Status: AC
  Administered 2016-03-27: 60 mg via INTRAMUSCULAR
  Filled 2016-03-27: qty 2

## 2016-03-27 MED ORDER — HYDROCODONE-ACETAMINOPHEN 5-325 MG PO TABS: 1.0000 | ORAL_TABLET | Freq: Four times a day (QID) | ORAL | 0 refills | Status: DC | PRN

## 2016-03-27 NOTE — ED Provider Notes (Signed)
[facility]-EMERGENCY DEPT Provider Note   CSN: [id number] Arrival date & time: 03/27/16  4098     History   Chief Complaint Chief Complaint  Patient presents with  . Back Pain  . Neck Pain    HPI Courtney Clements is a 45 y.o. female.  Pt reports increasing right upper back pain with possible spasm for 4-5 days. Began the evening after working above her head with her right arm for a pronlonged period of time (pt is an Tree surgeon). Reports 0400 today pain significantly worsened with pain in her right upper thoracic region and radiating towards her right neck and pain coming across from her right trapezius muscle towards her right neck. No CP or SOB. No fevers. No weakness of arms or legs. No trauma or injury besides what is described above. Worsened with movement of the neck and looking down.    The history is provided by the patient.    Past Medical History:  Diagnosis Date  . Arthritis   . Chronic constipation    taking Metamucil as needed  . Diarrhea   . Family history of anesthesia complication    pts mom has nausea and b/p drops  . GERD (gastroesophageal reflux disease)    occasionally but doesn't take anything  . Hemorrhoids   . History of bronchitis    couple of yrs ago  . History of migraine    last one at least 4-44months ago  . Joint pain   . Pneumonia 2002  . PONV (postoperative nausea and vomiting)    b/p drops low and nausea  . Seasonal allergies    takes Allegra daily and uses Flonase   . Umbilical hernia     Patient Active Problem List   Diagnosis Date Noted  . DUB (dysfunctional uterine bleeding) 03/26/2014    Past Surgical History:  Procedure Laterality Date  . CESAREAN SECTION  2008  . INSERTION OF MESH N/A 12/08/2013   Procedure: INSERTION OF MESH;  Surgeon: Axel Filler, MD;  Location: [facility] OR;  Service: General;  Laterality: N/A;  . KNEE SURGERY Left    x 2  . TUBAL LIGATION    . UMBILICAL HERNIA REPAIR N/A 12/08/2013   Procedure: LAPAROSCOPIC  UMBILICAL HERNIA;  Surgeon: Axel Filler, MD;  Location: [facility] OR;  Service: General;  Laterality: N/A;    OB History    Gravida Para Term Preterm AB Living             4   SAB TAB Ectopic Multiple Live Births                   Home Medications    Prior to Admission medications   Medication Sig Start Date End Date Taking? Authorizing Provider  benzonatate (TESSALON) 200 MG capsule Take 1 capsule by mouth daily. 04/20/14   Historical Provider, MD  CHERATUSSIN AC 100-10 MG/5ML syrup as needed. 04/20/14   Historical Provider, MD  diazepam (VALIUM) 5 MG tablet Take 1 tablet (5 mg total) by mouth every 8 (eight) hours as needed (dizziness). 03/27/16   Azalia Bilis, MD  ferrous sulfate 325 (65 FE) MG tablet Take 325 mg by mouth daily with breakfast.    Historical Provider, MD  fexofenadine (ALLEGRA) 180 MG tablet Take 180 mg by mouth daily.    Historical Provider, MD  fluticasone (FLONASE) 50 MCG/ACT nasal spray Place into both nostrils daily.    Historical Provider, MD  HYDROcodone-acetaminophen (NORCO/VICODIN) 5-325 MG tablet Take 1 tablet by mouth  every 6 (six) hours as needed for moderate pain. 03/27/16   Azalia Bilis, MD  ibuprofen (ADVIL,MOTRIN) 600 MG tablet Take 1 tablet (600 mg total) by mouth every 8 (eight) hours as needed. 03/27/16   Azalia Bilis, MD  methocarbamol (ROBAXIN) 500 MG tablet Take 1 tablet (500 mg total) by mouth every 8 (eight) hours as needed for muscle spasms. 03/27/16   Azalia Bilis, MD  misoprostol (CYTOTEC) 200 MCG tablet Take one tablet tonight at 7 pm 04/22/14   Ok Edwards, MD  PROAIR HFA 108 [phone] BASE) MCG/ACT inhaler as needed. 04/20/14   Historical Provider, MD  progesterone (PROMETRIUM) 200 MG capsule Take 1 capsule (200 mg total) by mouth at bedtime. 04/09/14   Ok Edwards, MD  pseudoephedrine (SUDAFED) 30 MG tablet Take 30 mg by mouth every 4 (four) hours as needed for congestion.    Historical Provider, MD    Family History History reviewed. No  pertinent family history.  Social History Social History  Substance Use Topics  . Smoking status: Former Games developer  . Smokeless tobacco: Not on file     Comment: quit smoking a year and a half ago  . Alcohol use Yes     Comment: socially     Allergies   Penicillins; Adhesive [tape]; Latex; Coconut oil; and Hibiclens [chlorhexidine gluconate]   Review of Systems Review of Systems  All other systems reviewed and are negative.    Physical Exam Updated Vital Signs BP 134/91 (BP Location: Right Arm)   Pulse 98   Temp 98.5 F (36.9 C) (Oral)   Resp 16   SpO2 100%   Physical Exam  Constitutional: She is oriented to person, place, and time. She appears well-developed and well-nourished. No distress.  HENT:  Head: Normocephalic and atraumatic.  Eyes: EOM are normal.  Neck: Normal range of motion.  Cardiovascular: Normal rate and regular rhythm.   Pulmonary/Chest: Effort normal and breath sounds normal.  Abdominal: Soft. She exhibits no distension. There is no tenderness.  Musculoskeletal: She exhibits no edema.  parathoracic tenderness and spasm of the right back. No meningeal signs. No weakness of arms or legs  Neurological: She is alert and oriented to person, place, and time.  Skin: Skin is warm and dry.  Psychiatric: She has a normal mood and affect. Judgment normal.  Nursing note and vitals reviewed.    ED Treatments / Results  Labs (all labs ordered are listed, but only abnormal results are displayed) Labs Reviewed - No data to display  EKG  EKG Interpretation None       Radiology No results found.  Procedures Procedures (including critical care time)  Medications Ordered in ED Medications  ketorolac (TORADOL) injection 60 mg (60 mg Intramuscular Given 03/27/16 1159)  diazepam (VALIUM) tablet 5 mg (5 mg Oral Given 03/27/16 1158)  oxyCODONE-acetaminophen (PERCOCET/ROXICET) 5-325 MG per tablet 1 tablet (1 tablet Oral Given 03/27/16 1158)     Initial  Impression / Assessment and Plan / ED Course  I have reviewed the triage vital signs and the nursing notes.  Pertinent labs & imaging results that were available during my care of the patient were reviewed by me and considered in my medical decision making (see chart for details).  Clinical Course    Suspect thoracic spasm. Breath sounds normal. Likely from overhead work. Heat, muscle relaxants, NSAIDs and short course of vicodin. No indication for labs or imaging. Dc home in good and improved condition  Final Clinical Impressions(s) / ED  Diagnoses   Final diagnoses:  Right-sided thoracic back pain    New Prescriptions New Prescriptions   DIAZEPAM (VALIUM) 5 MG TABLET    Take 1 tablet (5 mg total) by mouth every 8 (eight) hours as needed (dizziness).   HYDROCODONE-ACETAMINOPHEN (NORCO/VICODIN) 5-325 MG TABLET    Take 1 tablet by mouth every 6 (six) hours as needed for moderate pain.   IBUPROFEN (ADVIL,MOTRIN) 600 MG TABLET    Take 1 tablet (600 mg total) by mouth every 8 (eight) hours as needed.   METHOCARBAMOL (ROBAXIN) 500 MG TABLET    Take 1 tablet (500 mg total) by mouth every 8 (eight) hours as needed for muscle spasms.     Azalia BilisKevin Kyrstal Monterrosa, MD 03/27/16 [phone]

## 2016-03-27 NOTE — ED Triage Notes (Signed)
Pt c/o upper back pain between shoulder blades onset Friday. Self-treating with RICE and head. Onset 0400 today pain significantly worse, headache, decreased active ROM to neck. Noticed bulge in spine in mirror.

## 2016-03-27 NOTE — ED Notes (Signed)
MD at bedside. 

## 2016-11-21 ENCOUNTER — Encounter: Payer: Self-pay | Admitting: Gynecology

## 2017-11-05 ENCOUNTER — Emergency Department (HOSPITAL_COMMUNITY): Payer: Self-pay

## 2017-11-05 ENCOUNTER — Inpatient Hospital Stay (HOSPITAL_COMMUNITY)
Admission: EM | Admit: 2017-11-05 | Discharge: 2017-11-12 | DRG: 417 | Disposition: A | Discharge status: Home / Self Care | Payer: Self-pay | Attending: Internal Medicine | Admitting: Internal Medicine

## 2017-11-05 ENCOUNTER — Encounter (HOSPITAL_COMMUNITY): Payer: Self-pay | Admitting: Emergency Medicine

## 2017-11-05 DIAGNOSIS — Y848 Other medical procedures as the cause of abnormal reaction of the patient, or of later complication, without mention of misadventure at the time of the procedure: Secondary | ICD-10-CM | POA: Diagnosis not present

## 2017-11-05 DIAGNOSIS — J302 Other seasonal allergic rhinitis: Secondary | ICD-10-CM | POA: Diagnosis present

## 2017-11-05 DIAGNOSIS — R740 Nonspecific elevation of levels of transaminase and lactic acid dehydrogenase [LDH]: Secondary | ICD-10-CM

## 2017-11-05 DIAGNOSIS — T508X5A Adverse effect of diagnostic agents, initial encounter: Secondary | ICD-10-CM | POA: Diagnosis not present

## 2017-11-05 DIAGNOSIS — Z87891 Personal history of nicotine dependence: Secondary | ICD-10-CM

## 2017-11-05 DIAGNOSIS — R001 Bradycardia, unspecified: Secondary | ICD-10-CM | POA: Diagnosis not present

## 2017-11-05 DIAGNOSIS — K5730 Diverticulosis of large intestine without perforation or abscess without bleeding: Secondary | ICD-10-CM | POA: Diagnosis present

## 2017-11-05 DIAGNOSIS — K810 Acute cholecystitis: Secondary | ICD-10-CM | POA: Diagnosis present

## 2017-11-05 DIAGNOSIS — Y92238 Other place in hospital as the place of occurrence of the external cause: Secondary | ICD-10-CM | POA: Diagnosis not present

## 2017-11-05 DIAGNOSIS — K851 Biliary acute pancreatitis without necrosis or infection: Secondary | ICD-10-CM | POA: Diagnosis not present

## 2017-11-05 DIAGNOSIS — R51 Headache: Secondary | ICD-10-CM | POA: Diagnosis not present

## 2017-11-05 DIAGNOSIS — K8062 Calculus of gallbladder and bile duct with acute cholecystitis without obstruction: Principal | ICD-10-CM | POA: Diagnosis present

## 2017-11-05 DIAGNOSIS — Q796 Ehlers-Danlos syndrome: Secondary | ICD-10-CM

## 2017-11-05 DIAGNOSIS — R7401 Elevation of levels of liver transaminase levels: Secondary | ICD-10-CM | POA: Diagnosis present

## 2017-11-05 DIAGNOSIS — J392 Other diseases of pharynx: Secondary | ICD-10-CM | POA: Diagnosis not present

## 2017-11-05 DIAGNOSIS — Z880 Allergy status to penicillin: Secondary | ICD-10-CM

## 2017-11-05 DIAGNOSIS — Z888 Allergy status to other drugs, medicaments and biological substances status: Secondary | ICD-10-CM

## 2017-11-05 DIAGNOSIS — R0789 Other chest pain: Secondary | ICD-10-CM | POA: Diagnosis not present

## 2017-11-05 DIAGNOSIS — K219 Gastro-esophageal reflux disease without esophagitis: Secondary | ICD-10-CM | POA: Diagnosis present

## 2017-11-05 DIAGNOSIS — K8000 Calculus of gallbladder with acute cholecystitis without obstruction: Secondary | ICD-10-CM

## 2017-11-05 DIAGNOSIS — K8050 Calculus of bile duct without cholangitis or cholecystitis without obstruction: Secondary | ICD-10-CM

## 2017-11-05 DIAGNOSIS — R7989 Other specified abnormal findings of blood chemistry: Secondary | ICD-10-CM | POA: Diagnosis present

## 2017-11-05 DIAGNOSIS — R52 Pain, unspecified: Secondary | ICD-10-CM

## 2017-11-05 DIAGNOSIS — K5900 Constipation, unspecified: Secondary | ICD-10-CM | POA: Diagnosis present

## 2017-11-05 DIAGNOSIS — R933 Abnormal findings on diagnostic imaging of other parts of digestive tract: Secondary | ICD-10-CM

## 2017-11-05 HISTORY — DX: Ehlers-Danlos syndrome, unspecified: Q79.60

## 2017-11-05 LAB — BASIC METABOLIC PANEL
ANION GAP: 8 (ref 5–15)
BUN: 12 mg/dL (ref 6–20)
CHLORIDE: 108 mmol/L (ref 101–111)
CO2: 25 mmol/L (ref 22–32)
Calcium: 9.1 mg/dL (ref 8.9–10.3)
Creatinine, Ser: 0.92 mg/dL (ref 0.44–1.00)
GFR calc Af Amer: >60 mL/min (ref >60)
GLUCOSE: 91 mg/dL (ref 65–99)
POTASSIUM: 3.7 mmol/L (ref 3.5–5.1)
SODIUM: 141 mmol/L (ref 135–145)

## 2017-11-05 LAB — CBC
HCT: 42.5 % (ref 36.0–46.0)
HEMOGLOBIN: 14.2 g/dL (ref 12.0–15.0)
MCH: 32.1 pg (ref 26.0–34.0)
MCHC: 33.4 g/dL (ref 30.0–36.0)
MCV: 96.2 fL (ref 78.0–100.0)
Platelets: 288 10*3/uL (ref 150–400)
RBC: 4.42 MIL/uL (ref 3.87–5.11)
RDW: 12.6 % (ref 11.5–15.5)
WBC: 13.4 10*3/uL — AB (ref 4.0–10.5)

## 2017-11-05 LAB — I-STAT TROPONIN, ED: Troponin i, poc: 0 ng/mL (ref 0.00–0.08)

## 2017-11-05 LAB — I-STAT BETA HCG BLOOD, ED (MC, WL, AP ONLY)

## 2017-11-05 MED ORDER — IOPAMIDOL (ISOVUE-300) INJECTION 61%
100.0000 mL | Freq: Once | INTRAVENOUS | Status: AC | PRN
  Administered 2017-11-06: 100 mL via INTRAVENOUS

## 2017-11-05 MED ORDER — SODIUM CHLORIDE 0.9 % IV BOLUS
1000.0000 mL | Freq: Once | INTRAVENOUS | Status: AC
Start: 2017-11-05 — End: 2017-11-05
  Administered 2017-11-05: 1000 mL via INTRAVENOUS

## 2017-11-05 MED ORDER — KETOROLAC TROMETHAMINE 30 MG/ML IJ SOLN
30.0000 mg | Freq: Once | INTRAMUSCULAR | Status: AC
  Administered 2017-11-05: 30 mg via INTRAVENOUS
  Filled 2017-11-05: qty 1

## 2017-11-05 MED ORDER — IOPAMIDOL (ISOVUE-300) INJECTION 61%
INTRAVENOUS | Status: AC
  Filled 2017-11-05: qty 100

## 2017-11-05 MED ORDER — MORPHINE SULFATE (PF) 4 MG/ML IV SOLN
4.0000 mg | Freq: Once | INTRAVENOUS | Status: AC
  Administered 2017-11-05: 4 mg via INTRAVENOUS
  Filled 2017-11-05: qty 1

## 2017-11-05 MED ORDER — ONDANSETRON HCL 4 MG/2ML IJ SOLN
4.0000 mg | Freq: Once | INTRAMUSCULAR | Status: AC
  Administered 2017-11-05: 4 mg via INTRAVENOUS
  Filled 2017-11-05: qty 2

## 2017-11-05 NOTE — ED Provider Notes (Signed)
[facility]-EMERGENCY DEPT Provider Note   CSN: [id number] Arrival date & time: 11/05/17  1658     History   Chief Complaint Chief Complaint  Patient presents with  . Chest Pain    HPI Courtney Clements is a 47 y.o. female.  She is complaining of acute onset of subxiphoid pain that radiated into her back and after her breasts that occurred at 1 AM lasted about 20 minutes and resolved.  She said it was severe and viselike in nature.  It recurred again at around 1 PM and lasted another 20 minutes.  He recurred again around 4 PM today and is been constant since then.  She states she has had a similar episode like this about 4 months ago that happened twice and then has not recurred since then.  It causes her to feel short of breath and nauseous but no vomiting.  No cough fever.  No urinary symptoms no vaginal bleeding or discharge.  She is had a prior history of an umbilical hernia repair but that is her only abdominal surgery.  The history is provided by the patient.  Chest Pain   This is a new problem. The current episode started 12 to 24 hours ago. The problem has not changed since onset.The pain is present in the epigastric region. The pain is severe. The quality of the pain is described as vice-like. The pain radiates to the mid back. Associated symptoms include abdominal pain, back pain, nausea and shortness of breath. Pertinent negatives include no cough, no diaphoresis, no dizziness, no exertional chest pressure, no fever, no headaches, no leg pain, no numbness, no palpitations, no sputum production, no syncope and no vomiting. She has tried nothing for the symptoms. The treatment provided no relief. There are no known risk factors.  Pertinent negatives for past medical history include no seizures.    Past Medical History:  Diagnosis Date  . Arthritis   . Chronic constipation    taking Metamucil as needed  . Diarrhea   . Family history of anesthesia complication      pts mom has nausea and b/p drops  . GERD (gastroesophageal reflux disease)    occasionally but doesn't take anything  . Hemorrhoids   . History of bronchitis    couple of yrs ago  . History of migraine    last one at least 4-62months ago  . Joint pain   . Pneumonia 2002  . PONV (postoperative nausea and vomiting)    b/p drops low and nausea  . Seasonal allergies    takes Allegra daily and uses Flonase   . Umbilical hernia     Patient Active Problem List   Diagnosis Date Noted  . DUB (dysfunctional uterine bleeding) 03/26/2014    Past Surgical History:  Procedure Laterality Date  . CESAREAN SECTION  2008  . INSERTION OF MESH N/A 12/08/2013   Procedure: INSERTION OF MESH;  Surgeon: Axel Filler, MD;  Location: [facility] OR;  Service: General;  Laterality: N/A;  . KNEE SURGERY Left    x 2  . TUBAL LIGATION    . UMBILICAL HERNIA REPAIR N/A 12/08/2013   Procedure: LAPAROSCOPIC UMBILICAL HERNIA;  Surgeon: Axel Filler, MD;  Location: [facility] OR;  Service: General;  Laterality: N/A;     OB History    Gravida      Para      Term      Preterm      AB  Living  4     SAB      TAB      Ectopic      Multiple      Live Births               Home Medications    Prior to Admission medications   Medication Sig Start Date End Date Taking? Authorizing Provider  doxycycline (VIBRAMYCIN) 100 MG capsule Take 100 mg by mouth daily. 10/31/17  Yes [provider]  fexofenadine (ALLEGRA) 180 MG tablet Take 180 mg by mouth daily.   Yes [provider]  fluticasone (FLONASE) 50 MCG/ACT nasal spray Place into both nostrils daily.   Yes [provider]  ibuprofen (ADVIL,MOTRIN) 600 MG tablet Take 1 tablet (600 mg total) by mouth every 8 (eight) hours as needed. 03/27/16  Yes Azalia Bilis, MD  PROAIR HFA 108 [phone] BASE) MCG/ACT inhaler as needed. 04/20/14  Yes [provider]  pseudoephedrine (SUDAFED) 30 MG tablet Take 30 mg by mouth every 4  (four) hours as needed for congestion.   Yes [provider]  HYDROcodone-acetaminophen (NORCO/VICODIN) 5-325 MG tablet Take 1 tablet by mouth every 6 (six) hours as needed for moderate pain. Patient not taking: Reported on 11/05/2017 03/27/16   Azalia Bilis, MD  methocarbamol (ROBAXIN) 500 MG tablet Take 1 tablet (500 mg total) by mouth every 8 (eight) hours as needed for muscle spasms. Patient not taking: Reported on 11/05/2017 03/27/16   Azalia Bilis, MD    Family History No family history on file.  Social History Social History   Tobacco Use  . Smoking status: Former Games developer  . Smokeless tobacco: Never Used  Substance Use Topics  . Alcohol use: Yes    Comment: socially  . Drug use: No     Allergies   Penicillins; Adhesive [tape]; Latex; Coconut oil; and Hibiclens [chlorhexidine gluconate]   Review of Systems Review of Systems  Constitutional: Negative for chills, diaphoresis and fever.  HENT: Negative for ear pain and sore throat.   Eyes: Negative for pain and visual disturbance.  Respiratory: Positive for shortness of breath. Negative for cough and sputum production.   Cardiovascular: Positive for chest pain. Negative for palpitations and syncope.  Gastrointestinal: Positive for abdominal pain and nausea. Negative for vomiting.  Genitourinary: Negative for dysuria and hematuria.  Musculoskeletal: Positive for back pain. Negative for arthralgias.  Skin: Negative for color change and rash.  Neurological: Negative for dizziness, seizures, syncope, numbness and headaches.  All other systems reviewed and are negative.    Physical Exam Updated Vital Signs BP (!) 157/91 (BP Location: Left Arm)   Pulse 72   Temp 98.7 F (37.1 C) (Oral)   Resp 18   Ht  (1.651 m)   Wt 89.4 kg (197 lb)   LMP 11/01/2017   SpO2 100%   BMI 32.78 kg/m   Physical Exam  Constitutional: She is oriented to person, place, and time. She appears well-developed and well-nourished.  No distress.  HENT:  Head: Normocephalic and atraumatic.  Eyes: Conjunctivae are normal.  Neck: Neck supple.  Cardiovascular: Normal rate and regular rhythm.  No murmur heard. Pulmonary/Chest: Effort normal and breath sounds normal. No respiratory distress.  Abdominal: Soft. She exhibits no mass. There is tenderness in the right upper quadrant, epigastric area and left upper quadrant. There is no rigidity and no guarding.  Musculoskeletal: She exhibits no edema, tenderness or deformity.  Neurological: She is alert and oriented to person, place, and  time. She has normal strength. No cranial nerve deficit or sensory deficit. Gait normal. GCS eye subscore is 4. GCS verbal subscore is 5. GCS motor subscore is 6.  Skin: Skin is warm and dry.  Psychiatric: She has a normal mood and affect.  Nursing note and vitals reviewed.    ED Treatments / Results  Labs (all labs ordered are listed, but only abnormal results are displayed) Labs Reviewed  CBC - Abnormal; Notable for the following components:      Result Value   WBC 13.4 (*)    All other components within normal limits  BASIC METABOLIC PANEL  HEPATIC FUNCTION PANEL  LIPASE, BLOOD  I-STAT TROPONIN, ED  I-STAT BETA HCG BLOOD, ED ([facility], [facility], [facility] ONLY)  I-STAT TROPONIN, ED    EKG EKG Interpretation  Date/Time:  Tuesday November 05 2017 17:07:18 EDT Ventricular Rate:  76 PR Interval:  130 QRS Duration: 82 QT Interval:  406 QTC Calculation: 456 R Axis:   62 Text Interpretation:  Normal sinus rhythm Normal ECG no prior to compare with Confirmed by Meridee Score [phone]) on 11/05/2017 9:39:50 PM   Radiology Dg Chest 2 View  Result Date: 11/05/2017 CLINICAL DATA:  Middle chest pain EXAM: CHEST - 2 VIEW COMPARISON:  07/05/2011 FINDINGS: The heart size and mediastinal contours are within normal limits. Both lungs are clear. Minimal degenerative changes of the spine. IMPRESSION: No active cardiopulmonary disease. Electronically Signed   By:  Jasmine Pang M.D.   On: 11/05/2017 18:10    Procedures Procedures (including critical care time)  Medications Ordered in ED Medications  ketorolac (TORADOL) 30 MG/ML injection 30 mg (has no administration in time range)  ondansetron (ZOFRAN) injection 4 mg (has no administration in time range)  sodium chloride 0.9 % bolus 1,000 mL (has no administration in time range)  morphine 4 MG/ML injection 4 mg (has no administration in time range)     Initial Impression / Assessment and Plan / ED Course  I have reviewed the triage vital signs and the nursing notes.  Pertinent labs & imaging results that were available during my care of the patient were reviewed by me and considered in my medical decision making (see chart for details).  Clinical Course as of Nov 07 1726  Tue Nov 05, 2017  2206 Differential diagnosis includes ACS, PE, cholelithiasis, peptic ulcer disease, obstruction, pneumothorax.  Her pulse ox is 100% not tachycardic.  No real risk factors for PE.  Her EKG is unremarkable and first troponin negative.  She had a chest x-ray which showed no acute disease.  She does have an elevated white count and so I am adding on LFTs lipase and putting her in for CT abdomen and pelvis.  Symptomatic relief with some morphine Toradol fluids nausea medicine.   [MB]    Clinical Course User Index [MB] Terrilee Files, MD     Final Clinical Impressions(s) / ED Diagnoses   Final diagnoses:  Calculus of gallbladder with acute cholecystitis without obstruction    ED Discharge Orders    None       Terrilee Files, MD 11/06/17 1728

## 2017-11-05 NOTE — ED Triage Notes (Signed)
Pt reports last night had intermittent generalized chest pressure/squeezing sensation that radiates to her back. Repots had nausea and vomiting when this happened several weeks ago.

## 2017-11-06 ENCOUNTER — Emergency Department (HOSPITAL_COMMUNITY): Payer: Self-pay

## 2017-11-06 ENCOUNTER — Inpatient Hospital Stay (HOSPITAL_COMMUNITY): Payer: Self-pay

## 2017-11-06 ENCOUNTER — Other Ambulatory Visit: Payer: Self-pay

## 2017-11-06 ENCOUNTER — Encounter (HOSPITAL_COMMUNITY): Payer: Self-pay | Admitting: Internal Medicine

## 2017-11-06 DIAGNOSIS — R7401 Elevation of levels of liver transaminase levels: Secondary | ICD-10-CM | POA: Diagnosis present

## 2017-11-06 DIAGNOSIS — R740 Nonspecific elevation of levels of transaminase and lactic acid dehydrogenase [LDH]: Secondary | ICD-10-CM

## 2017-11-06 DIAGNOSIS — K810 Acute cholecystitis: Secondary | ICD-10-CM | POA: Diagnosis present

## 2017-11-06 DIAGNOSIS — K8000 Calculus of gallbladder with acute cholecystitis without obstruction: Secondary | ICD-10-CM

## 2017-11-06 DIAGNOSIS — R933 Abnormal findings on diagnostic imaging of other parts of digestive tract: Secondary | ICD-10-CM

## 2017-11-06 LAB — CBC
HEMATOCRIT: 37.4 % (ref 36.0–46.0)
Hemoglobin: 12.2 g/dL (ref 12.0–15.0)
MCH: 31.4 pg (ref 26.0–34.0)
MCHC: 32.6 g/dL (ref 30.0–36.0)
MCV: 96.1 fL (ref 78.0–100.0)
Platelets: 216 10*3/uL (ref 150–400)
RBC: 3.89 MIL/uL (ref 3.87–5.11)
RDW: 12.7 % (ref 11.5–15.5)
WBC: 7 10*3/uL (ref 4.0–10.5)

## 2017-11-06 LAB — COMPREHENSIVE METABOLIC PANEL
ALK PHOS: 72 U/L (ref 38–126)
ALT: 205 U/L — ABNORMAL HIGH (ref 14–54)
ANION GAP: 7 (ref 5–15)
AST: 266 U/L — ABNORMAL HIGH (ref 15–41)
Albumin: 3 g/dL — ABNORMAL LOW (ref 3.5–5.0)
BILIRUBIN TOTAL: 3.1 mg/dL — AB (ref 0.3–1.2)
BUN: 9 mg/dL (ref 6–20)
CALCIUM: 8 mg/dL — AB (ref 8.9–10.3)
CO2: 21 mmol/L — ABNORMAL LOW (ref 22–32)
Chloride: 109 mmol/L (ref 101–111)
Creatinine, Ser: 0.54 mg/dL (ref 0.44–1.00)
GLUCOSE: 102 mg/dL — AB (ref 65–99)
Potassium: 3.7 mmol/L (ref 3.5–5.1)
Sodium: 137 mmol/L (ref 135–145)
TOTAL PROTEIN: 5.3 g/dL — AB (ref 6.5–8.1)

## 2017-11-06 LAB — URINALYSIS, ROUTINE W REFLEX MICROSCOPIC
Glucose, UA: NEGATIVE mg/dL
Hgb urine dipstick: NEGATIVE
KETONES UR: 5 mg/dL — AB
Leukocytes, UA: NEGATIVE
Nitrite: NEGATIVE
PH: 5 (ref 5.0–8.0)
PROTEIN: NEGATIVE mg/dL
Specific Gravity, Urine: >1.046 — ABNORMAL HIGH (ref 1.005–1.030)

## 2017-11-06 LAB — I-STAT TROPONIN, ED: Troponin i, poc: 0 ng/mL (ref 0.00–0.08)

## 2017-11-06 LAB — HEPATIC FUNCTION PANEL
ALK PHOS: 77 U/L (ref 38–126)
ALT: 153 U/L — ABNORMAL HIGH (ref 14–54)
AST: 275 U/L — ABNORMAL HIGH (ref 15–41)
Albumin: 3.9 g/dL (ref 3.5–5.0)
BILIRUBIN DIRECT: 1.3 mg/dL — AB (ref 0.1–0.5)
BILIRUBIN INDIRECT: 1.2 mg/dL — AB (ref 0.3–0.9)
BILIRUBIN TOTAL: 2.5 mg/dL — AB (ref 0.3–1.2)
Total Protein: 6.9 g/dL (ref 6.5–8.1)

## 2017-11-06 LAB — LIPASE, BLOOD: Lipase: 33 U/L (ref 11–51)

## 2017-11-06 LAB — HIV ANTIBODY (ROUTINE TESTING W REFLEX): HIV Screen 4th Generation wRfx: NONREACTIVE

## 2017-11-06 MED ORDER — METHYLPREDNISOLONE SODIUM SUCC 40 MG IJ SOLR
40.0000 mg | Freq: Every day | INTRAMUSCULAR | Status: AC
  Administered 2017-11-07: 40 mg via INTRAVENOUS
  Filled 2017-11-06: qty 1

## 2017-11-06 MED ORDER — ALBUTEROL SULFATE (2.5 MG/3ML) 0.083% IN NEBU: 3.0000 mL | INHALATION_SOLUTION | Freq: Four times a day (QID) | RESPIRATORY_TRACT | Status: DC | PRN

## 2017-11-06 MED ORDER — IBUPROFEN 200 MG PO TABS
600.0000 mg | ORAL_TABLET | Freq: Three times a day (TID) | ORAL | Status: DC | PRN
  Administered 2017-11-11: 600 mg via ORAL
  Filled 2017-11-06: qty 3

## 2017-11-06 MED ORDER — CIPROFLOXACIN IN D5W 400 MG/200ML IV SOLN
400.0000 mg | Freq: Once | INTRAVENOUS | Status: AC
  Administered 2017-11-06: 400 mg via INTRAVENOUS
  Filled 2017-11-06: qty 200

## 2017-11-06 MED ORDER — DIPHENHYDRAMINE HCL 50 MG/ML IJ SOLN
50.0000 mg | Freq: Once | INTRAMUSCULAR | Status: AC
  Administered 2017-11-07: 50 mg via INTRAVENOUS
  Filled 2017-11-06: qty 1

## 2017-11-06 MED ORDER — ONDANSETRON HCL 4 MG/2ML IJ SOLN
4.0000 mg | Freq: Four times a day (QID) | INTRAMUSCULAR | Status: DC | PRN
  Administered 2017-11-06 – 2017-11-11 (×8): 4 mg via INTRAVENOUS
  Filled 2017-11-06 (×7): qty 2

## 2017-11-06 MED ORDER — GADOBENATE DIMEGLUMINE 529 MG/ML IV SOLN
20.0000 mL | Freq: Once | INTRAVENOUS | Status: AC | PRN
  Administered 2017-11-06: 18 mL via INTRAVENOUS

## 2017-11-06 MED ORDER — ACETAMINOPHEN 650 MG RE SUPP: 650.0000 mg | Freq: Four times a day (QID) | RECTAL | Status: DC | PRN

## 2017-11-06 MED ORDER — ONDANSETRON HCL 4 MG PO TABS
4.0000 mg | ORAL_TABLET | Freq: Four times a day (QID) | ORAL | Status: DC | PRN
  Filled 2017-11-06: qty 1

## 2017-11-06 MED ORDER — METRONIDAZOLE IN NACL 5-0.79 MG/ML-% IV SOLN
500.0000 mg | Freq: Three times a day (TID) | INTRAVENOUS | Status: DC
Start: 2017-11-06 — End: 2017-11-12
  Administered 2017-11-06 – 2017-11-12 (×20): 500 mg via INTRAVENOUS
  Filled 2017-11-06 (×19): qty 100

## 2017-11-06 MED ORDER — MORPHINE SULFATE (PF) 2 MG/ML IV SOLN
2.0000 mg | INTRAVENOUS | Status: DC | PRN
  Administered 2017-11-06 (×2): 4 mg via INTRAVENOUS
  Administered 2017-11-06 – 2017-11-07 (×3): 2 mg via INTRAVENOUS
  Administered 2017-11-07 (×2): 4 mg via INTRAVENOUS
  Filled 2017-11-06: qty 2
  Filled 2017-11-06: qty 1
  Filled 2017-11-06 (×3): qty 2
  Filled 2017-11-06: qty 1
  Filled 2017-11-06: qty 2

## 2017-11-06 MED ORDER — SODIUM CHLORIDE 0.9 % IV SOLN
INTRAVENOUS | Status: DC
  Administered 2017-11-06 – 2017-11-11 (×15): via INTRAVENOUS

## 2017-11-06 MED ORDER — ACETAMINOPHEN 325 MG PO TABS: 650.0000 mg | ORAL_TABLET | Freq: Four times a day (QID) | ORAL | Status: DC | PRN

## 2017-11-06 MED ORDER — CIPROFLOXACIN IN D5W 400 MG/200ML IV SOLN
400.0000 mg | Freq: Two times a day (BID) | INTRAVENOUS | Status: DC
  Administered 2017-11-06 – 2017-11-12 (×11): 400 mg via INTRAVENOUS
  Filled 2017-11-06 (×11): qty 200

## 2017-11-06 MED ORDER — DIPHENHYDRAMINE HCL 50 MG/ML IJ SOLN
25.0000 mg | Freq: Once | INTRAMUSCULAR | Status: AC
  Administered 2017-11-06: 25 mg via INTRAVENOUS

## 2017-11-06 MED ORDER — FENTANYL CITRATE (PF) 100 MCG/2ML IJ SOLN
50.0000 ug | Freq: Once | INTRAMUSCULAR | Status: AC
  Administered 2017-11-06: 50 ug via INTRAVENOUS
  Filled 2017-11-06: qty 2

## 2017-11-06 NOTE — H&P (Signed)
History and Physical    [name]:[id number] DOB: 1970/10/16 DOA: 11/05/2017  PCP: Farris Has, MD  Patient coming from: Home  I have personally briefly reviewed patient's old medical records in [facility]  Chief Complaint: Abd pain  HPI: Courtney Clements is a 47 y.o. female with medical history significant of GERD.  Patient presents to the ED with c/o upper abd pain that radiated to back and chest.  Occurred around 1AM yesterday, lasted 20 min, resolved.  Second episode 1pm yesterday, 20 min resolved.  Then reocurred 4pm yesterday and has been constant since that time.  Similar episode about 4 months ago, with 2 short episodes of pain, resolved and hasnt reoccurred since then.  PSHx of umbilical hernia repair.  Has family history of cholecystectomy in her daughter.   ED Course: AST 275 ALT 153, Tbili 2.5.  WBC 13.5k.  Started on cipro.  CT abd/pelvis suggests gall bladder wall thickening vs pericholicystic fluid.  US shows gallstones with wall thickening but negative sonographic Eulah Pont, findings are equivocal for cholecystitis.  As well as borderline to slightly enlarged CBD up to 7.59mm.   Review of Systems: As per HPI otherwise 10 point review of systems negative.   Past Medical History:  Diagnosis Date  . Arthritis   . Chronic constipation    taking Metamucil as needed  . Diarrhea   . Family history of anesthesia complication    pts mom has nausea and b/p drops  . GERD (gastroesophageal reflux disease)    occasionally but doesn't take anything  . Hemorrhoids   . History of bronchitis    couple of yrs ago  . History of migraine    last one at least 4-29months ago  . Joint pain   . Pneumonia 2002  . PONV (postoperative nausea and vomiting)    b/p drops low and nausea  . Seasonal allergies    takes Allegra daily and uses Flonase   . Umbilical hernia     Past Surgical History:  Procedure Laterality Date  . CESAREAN SECTION  2008  . INSERTION OF MESH  N/A 12/08/2013   Procedure: INSERTION OF MESH;  Surgeon: Axel Filler, MD;  Location: [facility] OR;  Service: General;  Laterality: N/A;  . KNEE SURGERY Left    x 2  . TUBAL LIGATION    . UMBILICAL HERNIA REPAIR N/A 12/08/2013   Procedure: LAPAROSCOPIC UMBILICAL HERNIA;  Surgeon: Axel Filler, MD;  Location: [facility] OR;  Service: General;  Laterality: N/A;     reports that she has quit smoking. She has never used smokeless tobacco. She reports that she drinks alcohol. She reports that she does not use drugs.  Allergies  Allergen Reactions  . Penicillins Anaphylaxis    Has patient had a PCN reaction causing immediate rash, facial/tongue/throat swelling, SOB or lightheadedness with hypotension: Yes Has patient had a PCN reaction causing severe rash involving mucus membranes or skin necrosis: No Has patient had a PCN reaction that required hospitalization: No Has patient had a PCN reaction occurring within the last 10 years: No If all of the above answers are "NO", then may proceed with Cephalosporin use.   . Adhesive [Tape]     Rash /break out  . Latex Other (See Comments)    irritation  . Coconut Oil Rash  . Hibiclens [Chlorhexidine Gluconate] Rash    Family History  Problem Relation Age of Onset  . Cholecystitis Daughter        Had cholecystectomy  Prior to Admission medications   Medication Sig Start Date End Date Taking? Authorizing Provider  doxycycline (VIBRAMYCIN) 100 MG capsule Take 100 mg by mouth daily. 10/31/17  Yes [provider]  fexofenadine (ALLEGRA) 180 MG tablet Take 180 mg by mouth daily.   Yes [provider]  fluticasone (FLONASE) 50 MCG/ACT nasal spray Place into both nostrils daily.   Yes [provider]  ibuprofen (ADVIL,MOTRIN) 600 MG tablet Take 1 tablet (600 mg total) by mouth every 8 (eight) hours as needed. 03/27/16  Yes Azalia Bilis, MD  PROAIR HFA [phone] BASE) MCG/ACT inhaler as needed. 04/20/14  Yes [provider]    pseudoephedrine (SUDAFED) 30 MG tablet Take 30 mg by mouth every 4 (four) hours as needed for congestion.   Yes [provider]    Physical Exam: Vitals:   11/05/17 1710 11/05/17 1934 11/05/17 2349 11/06/17 0202  BP: (!) 153/86 (!) 157/91 135/84 117/72  Pulse: 79 72 62 (!) 55  Resp: Temp: 98.8 F (37.1 C) 98.7 F (37.1 C)    TempSrc: Oral Oral    SpO2: 100% 100% 99% 97%  Weight: 89.4 kg (197 lb)     Height:  (1.651 m)       Constitutional: NAD, calm, comfortable Eyes: PERRL, lids and conjunctivae normal ENMT: Mucous membranes are moist. Posterior pharynx clear of any exudate or lesions.Normal dentition.  Neck: normal, supple, no masses, no thyromegaly Respiratory: clear to auscultation bilaterally, no wheezing, no crackles. Normal respiratory effort. No accessory muscle use.  Cardiovascular: Regular rate and rhythm, no murmurs / rubs / gallops. No extremity edema. 2+ pedal pulses. No carotid bruits.  Abdomen: no tenderness, no masses palpated. No hepatosplenomegaly. Bowel sounds positive.  Musculoskeletal: no clubbing / cyanosis. No joint deformity upper and lower extremities. Good ROM, no contractures. Normal muscle tone.  Skin: no rashes, lesions, ulcers. No induration Neurologic: CN 2-12 grossly intact. Sensation intact, DTR normal. Strength 5/5 in all 4.  Psychiatric: Normal judgment and insight. Alert and oriented x 3. Normal mood.    Labs on Admission: I have personally reviewed following labs and imaging studies  CBC: Recent Labs  Lab 11/05/17 1727  WBC 13.4*  HGB 14.2  HCT 42.5  MCV 96.2  PLT 288   Basic Metabolic Panel: Recent Labs  Lab 11/05/17 1727  NA 141  K 3.7  CL 108  CO2 25  GLUCOSE 91  BUN 12  CREATININE 0.92  CALCIUM 9.1   GFR: Estimated Creatinine Clearance: 84.4 mL/min (by C-G formula based on SCr of 0.92 mg/dL). Liver Function Tests: Recent Labs  Lab 11/05/17 2230  AST 275*  ALT 153*  ALKPHOS 77   BILITOT 2.5*  PROT 6.9  ALBUMIN 3.9   Recent Labs  Lab 11/05/17 2230  LIPASE 33   No results for input(s): AMMONIA in the last 168 hours. Coagulation Profile: No results for input(s): INR, PROTIME in the last 168 hours. Cardiac Enzymes: No results for input(s): CKTOTAL, CKMB, CKMBINDEX, TROPONINI in the last 168 hours. BNP (last 3 results) No results for input(s): PROBNP in the last 8760 hours. HbA1C: No results for input(s): HGBA1C in the last 72 hours. CBG: No results for input(s): GLUCAP in the last 168 hours. Lipid Profile: No results for input(s): CHOL, HDL, LDLCALC, TRIG, CHOLHDL, LDLDIRECT in the last 72 hours. Thyroid Function Tests: No results for input(s): TSH, T4TOTAL, FREET4, T3FREE, THYROIDAB in the last 72 hours. Anemia Panel: No results for  input(s): VITAMINB12, FOLATE, FERRITIN, TIBC, IRON, RETICCTPCT in the last 72 hours. Urine analysis:    Component Value Date/Time   COLORURINE YELLOW 03/12/2014 1122   APPEARANCEUR TURBID (A) 03/12/2014 1122   LABSPEC >1.030 (H) 03/12/2014 1122   PHURINE 5.5 03/12/2014 1122   GLUCOSEU NEG 03/12/2014 1122   HGBUR NEG 03/12/2014 1122   BILIRUBINUR NEG 03/12/2014 1122   KETONESUR NEG 03/12/2014 1122   PROTEINUR NEG 03/12/2014 1122   UROBILINOGEN 0.2 03/12/2014 1122   NITRITE NEG 03/12/2014 1122   LEUKOCYTESUR NEG 03/12/2014 1122    Radiological Exams on Admission: Dg Chest 2 View  Result Date: 11/05/2017 CLINICAL DATA:  Middle chest pain EXAM: CHEST - 2 VIEW COMPARISON:  07/05/2011 FINDINGS: The heart size and mediastinal contours are within normal limits. Both lungs are clear. Minimal degenerative changes of the spine. IMPRESSION: No active cardiopulmonary disease. Electronically Signed   By: Jasmine Pang M.D.   On: 11/05/2017 18:10   Ct Abdomen Pelvis W Contrast  Result Date: 11/06/2017 CLINICAL DATA:  Upper epigastric pain elevated white count EXAM: CT ABDOMEN AND PELVIS WITH CONTRAST TECHNIQUE: Multidetector CT  imaging of the abdomen and pelvis was performed using the standard protocol following bolus administration of intravenous contrast. CONTRAST:  ISOVUE-300 IOPAMIDOL (ISOVUE-300) INJECTION 61% COMPARISON:  CT 11/16/2013 FINDINGS: Lower chest: Lung bases demonstrate no acute consolidation or pleural effusion. Heart size within normal limits. Small esophageal hiatal hernia. Hepatobiliary: Focal fat infiltration near the falciform ligament. Small cyst near the gallbladder fossa. Appearance of mild periportal edema. Gallbladder wall thickening versus small amount of pericholecystic fluid. No calcified gallstones. Extrahepatic bile duct upper normal. Pancreas: No inflammatory changes. Similar appearance of proximal pancreatic duct dilatation. Soft tissue fullness at the uncinate process and head of pancreas. Spleen: Normal in size without focal abnormality. Adrenals/Urinary Tract: Adrenal glands are unremarkable. Kidneys are normal, without renal calculi, focal lesion, or hydronephrosis. Bladder is unremarkable. Stomach/Bowel: Stomach is within normal limits. Appendix appears normal. No evidence of bowel wall thickening, distention, or inflammatory changes. 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 No enlarged abdominal or pelvic lymph nodes. Reproductive: Uterus and bilateral adnexa are unremarkable. Other: Negative for free air or free fluid. Musculoskeletal: No acute or significant osseous findings. IMPRESSION: 1. Gallbladder wall thickening versus small amount of pericholecystic fluid. Suggest correlation with right upper quadrant abdominal ultrasound. 2. Similar appearance of prominent pancreatic duct and upper normal extrahepatic common bile duct. Soft tissue fullness at the uncinate process of the pancreas, probably due to adjacent nondistended duodenum. 3. Sigmoid colon diverticular disease without acute inflammation  Electronically Signed   By: Jasmine Pang M.D.   On: 11/06/2017 00:36   US Abdomen Limited Ruq  Result Date: 11/06/2017 CLINICAL DATA:  Abnormal CT with right upper quadrant pain EXAM: ULTRASOUND ABDOMEN LIMITED RIGHT UPPER QUADRANT COMPARISON:  CT 11/05/2017 FINDINGS: Gallbladder: Multiple small stones measuring up to 4.5 mm. Increased wall thickness at 4 mm. Negative sonographic Murphy. Common bile duct: Diameter: Borderline to slightly enlarged measuring up to 7.5 mm. Liver: Small cyst adjacent to the gallbladder measuring 1.2 cm. Liver otherwise normal. Portal vein is patent on color Doppler imaging with normal direction of blood flow towards the liver. IMPRESSION: 1. Gallstones with wall thickening but negative sonographic Eulah Pont, findings are equivocal for cholecystitis. If this remains a clinical concern, further evaluation with nuclear medicine hepatobiliary imaging could be obtained to further evaluate 2. Borderline to slightly enlarged common bile duct up to  7.5 mm. Recommend correlation with LFTs with follow-up MRCP as indicated. Electronically Signed   By: Jasmine Pang M.D.   On: 11/06/2017 01:35    EKG: Independently reviewed.  Assessment/Plan Principal Problem:   Acute cholecystitis Active Problems:   Transaminitis    1. Probable Acute cholecystitis - 1. Transaminitis, RUQ abd pain, suspect imaging findings. 2. Will leave on cipro / flagyl started in ED 3. Repeat CMP in AM 4. Call GI in AM: suspect MRCP may be next step 5. Will keep patient NPO just in case ERCP is needed. 6. IVF: NS at 125 cc/hr 7. Call surgery in AM: most likely they will want [place] to make sure duct is clear first before considering surgery.  DVT prophylaxis: SCDs Code Status: Full Family Communication: No family in room Disposition Plan: Home after admit Consults called: None Admission status: Admit to inpatient   Hillary Bow DO [facility] Pager [phone]  If 7AM-7PM, please  contact day team taking care of patient [email or web address] Password [facility]  11/06/2017, 4:17 AM

## 2017-11-06 NOTE — ED Provider Notes (Signed)
Patient was left at change of shift to get results of her CT scan.  At 12:40 AM I reviewed her CT scan which was suggestive of cholecystitis.  She did have an elevated white blood cell count.  We discussed all of her test results.  Due to penicillin allergy she was given Cipro IV.  I talked to the patient about her CT results and we did a ultrasound to look at her gallbladder.  2:50 AM I talked to the patient about her ultrasound result, her findings are suggestive of cholecystitis.  She is agreeable to being admitted.  She states her pain is starting to return and she was given IV fentanyl for her pain.  03:04 AM Dr Julian Reil, hospitalist, will admit  Dg Chest 2 View  Result Date: 11/05/2017 CLINICAL DATA:  Middle chest pain EXAM: CHEST - 2 VIEW COMPARISON:  07/05/2011 FINDINGS: The heart size and mediastinal contours are within normal limits. Both lungs are clear. Minimal degenerative changes of the spine. IMPRESSION: No active cardiopulmonary disease. Electronically Signed   By: Jasmine Pang M.D.   On: 11/05/2017 18:10   Ct Abdomen Pelvis W Contrast  Result Date: 11/06/2017 CLINICAL DATA:  Upper epigastric pain elevated white count EXAM: CT ABDOMEN AND PELVIS WITH CONTRAST TECHNIQUE: Multidetector CT imaging of the abdomen and pelvis was performed using the standard protocol following bolus administration of intravenous contrast. CONTRAST:  ISOVUE-300 IOPAMIDOL (ISOVUE-300) INJECTION 61% COMPARISON:  CT 11/16/2013 FINDINGS: Lower chest: Lung bases demonstrate no acute consolidation or pleural effusion. Heart size within normal limits. Small esophageal hiatal hernia. Hepatobiliary: Focal fat infiltration near the falciform ligament. Small cyst near the gallbladder fossa. Appearance of mild periportal edema. Gallbladder wall thickening versus small amount of pericholecystic fluid. No calcified gallstones. Extrahepatic bile duct upper normal. Pancreas: No inflammatory changes. Similar appearance of  proximal pancreatic duct dilatation. Soft tissue fullness at the uncinate process and head of pancreas. Spleen: Normal in size without focal abnormality. Adrenals/Urinary Tract: Adrenal glands are unremarkable. Kidneys are normal, without renal calculi, focal lesion, or hydronephrosis. Bladder is unremarkable. Stomach/Bowel: Stomach is within normal limits. Appendix appears normal. No evidence of bowel wall thickening, distention, or inflammatory changes. 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 No enlarged abdominal or pelvic lymph nodes. Reproductive: Uterus and bilateral adnexa are unremarkable. Other: Negative for free air or free fluid. Musculoskeletal: No acute or significant osseous findings. IMPRESSION: 1. Gallbladder wall thickening versus small amount of pericholecystic fluid. Suggest correlation with right upper quadrant abdominal ultrasound. 2. Similar appearance of prominent pancreatic duct and upper normal extrahepatic common bile duct. Soft tissue fullness at the uncinate process of the pancreas, probably due to adjacent nondistended duodenum. 3. Sigmoid colon diverticular disease without acute inflammation Electronically Signed   By: Jasmine Pang M.D.   On: 11/06/2017 00:36   US Abdomen Limited Ruq  Result Date: 11/06/2017 CLINICAL DATA:  Abnormal CT with right upper quadrant pain EXAM: ULTRASOUND ABDOMEN LIMITED RIGHT UPPER QUADRANT COMPARISON:  CT 11/05/2017 FINDINGS: Gallbladder: Multiple small stones measuring up to 4.5 mm. Increased wall thickness at 4 mm. Negative sonographic Murphy. Common bile duct: Diameter: Borderline to slightly enlarged measuring up to 7.5 mm. Liver: Small cyst adjacent to the gallbladder measuring 1.2 cm. Liver otherwise normal. Portal vein is patent on color Doppler imaging with normal direction of blood flow towards the liver. IMPRESSION: 1. Gallstones with wall thickening but  negative sonographic Eulah Pont, findings are equivocal for cholecystitis. If this  remains a clinical concern, further evaluation with nuclear medicine hepatobiliary imaging could be obtained to further evaluate 2. Borderline to slightly enlarged common bile duct up to 7.5 mm. Recommend correlation with LFTs with follow-up MRCP as indicated. Electronically Signed   By: Jasmine Pang M.D.   On: 11/06/2017 01:35   Diagnoses that have been ruled out:  None  Diagnoses that are still under consideration:  None  Final diagnoses:  Calculus of gallbladder with acute cholecystitis without obstruction   Plan admission  Devoria Albe, MD, Concha Pyo, MD 11/06/17 [phone]

## 2017-11-06 NOTE — ED Notes (Signed)
ED TO INPATIENT HANDOFF REPORT  Name/Age/Gender Courtney Clements 47 y.o. female  Code Status    Code Status Orders  (From admission, onward)        Start     Ordered   11/06/17 0422  Full code  Continuous     11/06/17 0422    Code Status History    This patient has a current code status but no historical code status.      Home/SNF/Other Home  Chief Complaint chest and back pains  Level of Care/Admitting Diagnosis ED Disposition    ED Disposition Condition Comment   Admit  Hospital Area: [facility] [[id number]]  Level of Care: Med-Surg [16]  Diagnosis: Acute calculous cholecystitis [992426]  Admitting Physician: Etta Quill [[id number]]  Attending Physician: Etta Quill [[id number]]  Estimated length of stay: past midnight tomorrow  Certification:: I certify this patient will need inpatient services for at least 2 midnights  PT Class (Do Not Modify): Inpatient [101]  PT Acc Code (Do Not Modify): Private [1]       Medical History Past Medical History:  Diagnosis Date  . Arthritis   . Chronic constipation    taking Metamucil as needed  . Diarrhea   . Family history of anesthesia complication    pts mom has nausea and b/p drops  . GERD (gastroesophageal reflux disease)    occasionally but doesn't take anything  . Hemorrhoids   . History of bronchitis    couple of yrs ago  . History of migraine    last one at least 4-36month ago  . Joint pain   . Pneumonia 2002  . PONV (postoperative nausea and vomiting)    b/p drops low and nausea  . Seasonal allergies    takes Allegra daily and uses Flonase   . Umbilical hernia     Allergies Allergies  Allergen Reactions  . Penicillins Anaphylaxis    Has patient had a PCN reaction causing immediate rash, facial/tongue/throat swelling, SOB or lightheadedness with hypotension: Yes Has patient had a PCN reaction causing severe rash involving mucus membranes or skin necrosis: No Has patient had a PCN  reaction that required hospitalization: No Has patient had a PCN reaction occurring within the last 10 years: No If all of the above answers are "NO", then may proceed with Cephalosporin use.   . Adhesive [Tape]     Rash /break out  . Latex Other (See Comments)    irritation  . Coconut Oil Rash  . Hibiclens [Chlorhexidine Gluconate] Rash    IV Location/Drains/Wounds Patient Lines/Drains/Airways Status   Active Line/Drains/Airways    Name:   Placement date:   Placement time:   Site:   Days:   Peripheral IV 12/08/13 Left Hand   12/08/13    1020    Hand   1429   Peripheral IV (Ped) 11/05/17 Antecubital   11/05/17    2344     1   Incision - 4 Ports Abdomen 1: Umbilicus 2: Upper;Mid 3: Upper;Left 4: Left;Lower   12/08/13    -     [id number]         Labs/Imaging Results for orders placed or performed during the hospital encounter of 11/05/17 (from the past 48 hour(s))  Basic metabolic panel     Status: None   Collection Time: 11/05/17  5:27 PM  Result Value Ref Range   Sodium 141 135 - 145 mmol/L   Potassium 3.7 3.5 - 5.1 mmol/L  Chloride 108 101 - 111 mmol/L   CO2 25 22 - 32 mmol/L   Glucose, Bld 91 65 - 99 mg/dL   BUN 12 6 - 20 mg/dL   Creatinine, Ser 0.92 0.44 - 1.00 mg/dL   Calcium 9.1 8.9 - 10.3 mg/dL   GFR calc non Af Amer >60 >60 mL/min   GFR calc Af Amer >60 >60 mL/min    Comment: (NOTE) The eGFR has been calculated using the CKD EPI equation. This calculation has not been validated in all clinical situations. eGFR's persistently <60 mL/min signify possible Chronic Kidney Disease.    Anion gap 8 5 - 15    Comment: Performed at [facility], [place] [address] [place], [place] [address]  CBC     Status: Abnormal   Collection Time: 11/05/17  5:27 PM  Result Value Ref Range   WBC 13.4 (H) 4.0 - 10.5 K/uL   RBC 4.42 3.87 - 5.11 MIL/uL   Hemoglobin 14.2 12.0 - 15.0 g/dL   HCT 42.5 36.0 - 46.0 %   MCV 96.2 78.0 - 100.0 fL   MCH 32.1 26.0 - 34.0 pg    MCHC 33.4 30.0 - 36.0 g/dL   RDW 12.6 11.5 - 15.5 %   Platelets 288 150 - 400 K/uL    Comment: Performed at [facility], [place] [address], [place], [place] [address]  I-stat troponin, ED     Status: None   Collection Time: 11/05/17  5:31 PM  Result Value Ref Range   Troponin i, poc 0.00 0.00 - 0.08 ng/mL   Comment 3            Comment: Due to the release kinetics of cTnI, a negative result within the first hours of the onset of symptoms does not rule out myocardial infarction with certainty. If myocardial infarction is still suspected, repeat the test at appropriate intervals.   I-Stat beta hCG blood, ED     Status: None   Collection Time: 11/05/17  5:32 PM  Result Value Ref Range   I-stat hCG, quantitative <5.0 <5 mIU/mL   Comment 3            Comment:   GEST. AGE      CONC.  (mIU/mL)   <=1 WEEK        5 - 50     2 WEEKS       50 - 500     3 WEEKS       100 - 10,000     4 WEEKS     1,000 - 30,000        FEMALE AND NON-PREGNANT FEMALE:     LESS THAN 5 mIU/mL   Hepatic function panel     Status: Abnormal   Collection Time: 11/05/17 10:30 PM  Result Value Ref Range   Total Protein 6.9 6.5 - 8.1 g/dL   Albumin 3.9 3.5 - 5.0 g/dL   AST 275 (H) 15 - 41 U/L   ALT 153 (H) 14 - 54 U/L   Alkaline Phosphatase 77 38 - 126 U/L   Total Bilirubin 2.5 (H) 0.3 - 1.2 mg/dL   Bilirubin, Direct 1.3 (H) 0.1 - 0.5 mg/dL   Indirect Bilirubin 1.2 (H) 0.3 - 0.9 mg/dL    Comment: Performed at [facility], [place] [address] [place], [place] [address]  Lipase, blood     Status: None   Collection Time: 11/05/17 10:30 PM  Result Value Ref Range  Lipase 33 11 - 51 U/L    Comment: Performed at [facility], [place] [address] [place], [place] [address]  I-stat troponin, ED     Status: None   Collection Time: 11/06/17 12:07 AM  Result Value Ref Range   Troponin i, poc 0.00 0.00 - 0.08 ng/mL   Comment 3            Comment: Due to the release  kinetics of cTnI, a negative result within the first hours of the onset of symptoms does not rule out myocardial infarction with certainty. If myocardial infarction is still suspected, repeat the test at appropriate intervals.    Dg Chest 2 View  Result Date: 11/05/2017 CLINICAL DATA:  Middle chest pain EXAM: CHEST - 2 VIEW COMPARISON:  07/05/2011 FINDINGS: The heart size and mediastinal contours are within normal limits. Both lungs are clear. Minimal degenerative changes of the spine. IMPRESSION: No active cardiopulmonary disease. Electronically Signed   By: Donavan Foil M.D.   On: 11/05/2017 18:10   Ct Abdomen Pelvis W Contrast  Result Date: 11/06/2017 CLINICAL DATA:  Upper epigastric pain elevated white count EXAM: CT ABDOMEN AND PELVIS WITH CONTRAST TECHNIQUE: Multidetector CT imaging of the abdomen and pelvis was performed using the standard protocol following bolus administration of intravenous contrast. CONTRAST:  168m ISOVUE-300 IOPAMIDOL (ISOVUE-300) INJECTION 61% COMPARISON:  CT 11/16/2013 FINDINGS: Lower chest: Lung bases demonstrate no acute consolidation or pleural effusion. Heart size within normal limits. Small esophageal hiatal hernia. Hepatobiliary: Focal fat infiltration near the falciform ligament. Small cyst near the gallbladder fossa. Appearance of mild periportal edema. Gallbladder wall thickening versus small amount of pericholecystic fluid. No calcified gallstones. Extrahepatic bile duct upper normal. Pancreas: No inflammatory changes. Similar appearance of proximal pancreatic duct dilatation. Soft tissue fullness at the uncinate process and head of pancreas. Spleen: Normal in size without focal abnormality. Adrenals/Urinary Tract: Adrenal glands are unremarkable. Kidneys are normal, without renal calculi, focal lesion, or hydronephrosis. Bladder is unremarkable. Stomach/Bowel: Stomach is within normal limits. Appendix appears normal. No evidence of bowel wall thickening,  distention, or inflammatory changes. 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 No enlarged abdominal or pelvic lymph nodes. Reproductive: Uterus and bilateral adnexa are unremarkable. Other: Negative for free air or free fluid. Musculoskeletal: No acute or significant osseous findings. IMPRESSION: 1. Gallbladder wall thickening versus small amount of pericholecystic fluid. Suggest correlation with right upper quadrant abdominal ultrasound. 2. Similar appearance of prominent pancreatic duct and upper normal extrahepatic common bile duct. Soft tissue fullness at the uncinate process of the pancreas, probably due to adjacent nondistended duodenum. 3. Sigmoid colon diverticular disease without acute inflammation Electronically Signed   By: KDonavan FoilM.D.   On: 11/06/2017 00:36   UKoreaAbdomen Limited Ruq  Result Date: 11/06/2017 CLINICAL DATA:  Abnormal CT with right upper quadrant pain EXAM: ULTRASOUND ABDOMEN LIMITED RIGHT UPPER QUADRANT COMPARISON:  CT 11/05/2017 FINDINGS: Gallbladder: Multiple small stones measuring up to 4.5 mm. Increased wall thickness at 4 mm. Negative sonographic Murphy. Common bile duct: Diameter: Borderline to slightly enlarged measuring up to 7.5 mm. Liver: Small cyst adjacent to the gallbladder measuring 1.2 cm. Liver otherwise normal. Portal vein is patent on color Doppler imaging with normal direction of blood flow towards the liver. IMPRESSION: 1. Gallstones with wall thickening but negative sonographic MPercell Miller findings are equivocal for cholecystitis. If this remains a clinical concern, further evaluation with nuclear medicine hepatobiliary imaging could be obtained to  further evaluate 2. Borderline to slightly enlarged common bile duct up to 7.5 mm. Recommend correlation with LFTs with follow-up MRCP as indicated. Electronically Signed   By: Donavan Foil M.D.   On: 11/06/2017 01:35    Pending  Labs Unresulted Labs (From admission, onward)   Start     Ordered   11/06/17 0500  Comprehensive metabolic panel  Tomorrow morning,   R     11/06/17 0357   11/06/17 0500  CBC  Tomorrow morning,   R     11/06/17 0422   11/06/17 0421  HIV antibody (Routine Testing)  Once,   R     11/06/17 0422      Vitals/Pain Today's Vitals   11/06/17 0500 11/06/17 0511 11/06/17 0535 11/06/17 0545  BP: 120/87  120/87 120/65  Pulse:   63 (!) 51  Resp:   18   Temp:      TempSrc:      SpO2:   96% 98%  Weight:      Height:      PainSc:  2       Isolation Precautions No active isolations  Medications Medications  iopamidol (ISOVUE-300) 61 % injection (has no administration in time range)  morphine 2 MG/ML injection 2-4 mg (has no administration in time range)  0.9 %  sodium chloride infusion ( Intravenous New Bag/Given 11/06/17 0542)  albuterol (PROVENTIL) (2.5 MG/3ML) 0.083% nebulizer solution 3 mL (has no administration in time range)  ibuprofen (ADVIL,MOTRIN) tablet 600 mg (has no administration in time range)  metroNIDAZOLE (FLAGYL) IVPB 500 mg (500 mg Intravenous New Bag/Given 11/06/17 0541)  acetaminophen (TYLENOL) tablet 650 mg (has no administration in time range)    Or  acetaminophen (TYLENOL) suppository 650 mg (has no administration in time range)  ondansetron (ZOFRAN) tablet 4 mg (has no administration in time range)    Or  ondansetron (ZOFRAN) injection 4 mg (has no administration in time range)  ciprofloxacin (CIPRO) IVPB 400 mg (has no administration in time range)  ketorolac (TORADOL) 30 MG/ML injection 30 mg (30 mg Intravenous Given 11/05/17 2345)  ondansetron (ZOFRAN) injection 4 mg (4 mg Intravenous Given 11/05/17 2345)  sodium chloride 0.9 % bolus 1,000 mL (0 mLs Intravenous Stopped 11/05/17 2353)  morphine 4 MG/ML injection 4 mg (4 mg Intravenous Given 11/05/17 2345)  iopamidol (ISOVUE-300) 61 % injection 100 mL (100 mLs Intravenous Contrast Given 11/06/17 0001)  ciprofloxacin  (CIPRO) IVPB 400 mg (0 mg Intravenous Stopped 11/06/17 0250)  fentaNYL (SUBLIMAZE) injection 50 mcg (50 mcg Intravenous Given 11/06/17 0332)    Mobility walks

## 2017-11-06 NOTE — Progress Notes (Signed)
The patient mentioned to me that she had a rash on her face and perioral area after she was given contrast for the MRCP. She did not have any problems with shortness of breath or respiratory difficulty. She states she did not have this similar response when she receive contrast for the CT scan. In light of this however it might be reasonable to give her a dose of Benadryl and Solu-Medrol prior to ERCP given that contrast is used.

## 2017-11-06 NOTE — Consult Note (Signed)
Subjective:   HPI  The patient is a 47 year old female who started to have upper abdominal pain yesterday with radiation to the right shoulder blade area. She came to the emergency room for evaluation. She was found to have gallstones. She was found to have elevated liver enzymes. Labs have been reviewed. She had an MRCP which showed gallstones and to small distal common bile duct stones.GI was consulted for further evaluation. She is stable at the present time.  Review of Systems No chest pain or shortness of breath  Past Medical History:  Diagnosis Date  . Arthritis   . Chronic constipation    taking Metamucil as needed  . Diarrhea   . Ehlers-Danlos disease   . Family history of anesthesia complication    pts mom has nausea and b/p drops  . GERD (gastroesophageal reflux disease)    occasionally but doesn't take anything  . Hemorrhoids   . History of bronchitis    couple of yrs ago  . History of migraine    last one at least 4-26months ago  . Joint pain   . Pneumonia 2002  . PONV (postoperative nausea and vomiting)    b/p drops low and nausea  . Seasonal allergies    takes Allegra daily and uses Flonase   . Umbilical hernia    Past Surgical History:  Procedure Laterality Date  . CESAREAN SECTION  2008  . INSERTION OF MESH N/A 12/08/2013   Procedure: INSERTION OF MESH;  Surgeon: Axel Filler, MD;  Location: [facility] OR;  Service: General;  Laterality: N/A;  . KNEE SURGERY Left    x 2  . TUBAL LIGATION    . UMBILICAL HERNIA REPAIR N/A 12/08/2013   Procedure: LAPAROSCOPIC UMBILICAL HERNIA;  Surgeon: Axel Filler, MD;  Location: [facility] OR;  Service: General;  Laterality: N/A;   Social History   Socioeconomic History  . Marital status: Married    Spouse name: Not on file  . Number of children: Not on file  . Years of education: Not on file  . Highest education level: Not on file  Occupational History  . Not on file  Social Needs  . Financial resource strain: Not on file  .  Food insecurity:    Worry: Not on file    Inability: Not on file  . Transportation needs:    Medical: Not on file    Non-medical: Not on file  Tobacco Use  . Smoking status: Former Games developer  . Smokeless tobacco: Never Used  Substance and Sexual Activity  . Alcohol use: Yes    Comment: socially  . Drug use: No  . Sexual activity: Yes    Birth control/protection: None  Lifestyle  . Physical activity:    Days per week: Not on file    Minutes per session: Not on file  . Stress: Not on file  Relationships  . Social connections:    Talks on phone: Not on file    Gets together: Not on file    Attends religious service: Not on file    Active member of club or organization: Not on file    Attends meetings of clubs or organizations: Not on file    Relationship status: Not on file  . Intimate partner violence:    Fear of current or ex partner: Not on file    Emotionally abused: Not on file    Physically abused: Not on file    Forced sexual activity: Not on file  Other Topics Concern  .  Not on file  Social History Narrative  . Not on file   family history includes Cholecystitis in her daughter.  Current Facility-Administered Medications:  .  0.9 %  sodium chloride infusion, , Intravenous, Continuous, Hillary Bow, DO, Last Rate: 125 mL/hr at 11/06/17 0935 .  acetaminophen (TYLENOL) tablet 650 mg, 650 mg, Oral, Q6H PRN **OR** acetaminophen (TYLENOL) suppository 650 mg, 650 mg, Rectal, Q6H PRN, Julian Reil, Jared M, DO .  albuterol (PROVENTIL) (2.5 MG/3ML) 0.083% nebulizer solution 3 mL, 3 mL, Inhalation, Q6H PRN, Hillary Bow, DO .  ciprofloxacin (CIPRO) IVPB 400 mg, 400 mg, Intravenous, Q12H, Lorenza Evangelist, RPH, Last Rate: 200 mL/hr at 11/06/17 1459, 400 mg at 11/06/17 1459 .  ibuprofen (ADVIL,MOTRIN) tablet 600 mg, 600 mg, Oral, Q8H PRN, Hillary Bow, DO .  metroNIDAZOLE (FLAGYL) IVPB 500 mg, 500 mg, Intravenous, Q8H, Hillary Bow, DO, Stopped at 11/06/17 1423 .   morphine 2 MG/ML injection 2-4 mg, 2-4 mg, Intravenous, Q4H PRN, Hillary Bow, DO, 4 mg at 11/06/17 1506 .  ondansetron (ZOFRAN) tablet 4 mg, 4 mg, Oral, Q6H PRN **OR** ondansetron (ZOFRAN) injection 4 mg, 4 mg, Intravenous, Q6H PRN, Hillary Bow, DO, 4 mg at 11/06/17 1043 Allergies  Allergen Reactions  . Penicillins Anaphylaxis    Has patient had a PCN reaction causing immediate rash, facial/tongue/throat swelling, SOB or lightheadedness with hypotension: Yes Has patient had a PCN reaction causing severe rash involving mucus membranes or skin necrosis: No Has patient had a PCN reaction that required hospitalization: No Has patient had a PCN reaction occurring within the last 10 years: No If all of the above answers are "NO", then may proceed with Cephalosporin use.   . Gadolinium Derivatives Hives, Itching and Swelling    immediatly after contrast injection pt with sneezing and oral swelling. Light chest tightness. Pt was evaluated by Dr Ty Hilts and administered  of benadryl IV.   . Adhesive [Tape]     Rash /break out  . Contrast Media [Iodinated Diagnostic Agents] Swelling    Swelling, hives, itching   . Latex Other (See Comments)    irritation  . Coconut Oil Rash  . Hibiclens [Chlorhexidine Gluconate] Rash     Objective:     BP 127/83 (BP Location: Right Arm)   Pulse (!) 51   Temp 98.3 F (36.8 C) (Oral)   Resp 16   Ht  (1.651 m)   Wt 89.4 kg (197 lb)   LMP 11/01/2017 Comment: negative beta HCG 11/05/17  SpO2 99%   BMI 32.78 kg/m   No distress  Heart regular rhythm no murmurs  Lungs clear  Abdomen: Bowel sounds normal, soft, nontender  Laboratory No components found for: D1    Assessment:     Gallstones  Common bile duct stones      Plan:     I discussed ERCP with sphincterotomy and stone extraction from the common bile duct. I explained the potential risks of bleeding, infection, perforation, and pancreatitis. She understands and consents  to the procedure. After this has been completed and the common bile duct has been cleared we will get surgery involved for cholecystectomy. I will discuss with one of my partners who do ERCPs to determine when we will be able to do this.

## 2017-11-06 NOTE — Progress Notes (Signed)
Pharmacy Antibiotic Note  Courtney Clements is a 47 y.o. female admitted on 11/05/2017 with intra-abdominal infection.  Pharmacy has been consulted for cipro dosing.  Plan: Cipro 400 mg IV q12h Flagyl 500 mg IV q8h (MD)  Height:  (165.1 cm) Weight: 197 lb (89.4 kg) IBW/kg (Calculated) : 57  Temp (24hrs), Avg:98.8 F (37.1 C), Min:98.7 F (37.1 C), Max:98.8 F (37.1 C)  Recent Labs  Lab 11/05/17 1727  WBC 13.4*  CREATININE 0.92    Estimated Creatinine Clearance: 84.4 mL/min (by C-G formula based on SCr of 0.92 mg/dL).    Allergies  Allergen Reactions  . Penicillins Anaphylaxis    Has patient had a PCN reaction causing immediate rash, facial/tongue/throat swelling, SOB or lightheadedness with hypotension: Yes Has patient had a PCN reaction causing severe rash involving mucus membranes or skin necrosis: No Has patient had a PCN reaction that required hospitalization: No Has patient had a PCN reaction occurring within the last 10 years: No If all of the above answers are "NO", then may proceed with Cephalosporin use.   . Adhesive [Tape]     Rash /break out  . Latex Other (See Comments)    irritation  . Coconut Oil Rash  . Hibiclens [Chlorhexidine Gluconate] Rash    Antimicrobials this admission: 5/1 cipro >>  5/1 flagyl >>   Dose adjustments this admission:   Microbiology results:  BCx:   UCx:    Sputum:    MRSA PCR:   Thank you for allowing pharmacy to be a part of this patient's care.  Lorenza Evangelist 11/06/2017 4:45 AM

## 2017-11-06 NOTE — Progress Notes (Signed)
PROGRESS NOTE    ABREE ROMICK  QMV:[id number] DOB: 11-06-1970 DOA: 11/05/2017 PCP: No primary care provider on file.   Brief Narrative:  HPI On 11/06/2017 by Dr. Lyda Perone TOMIA ENLOW is a 47 y.o. female with medical history significant of GERD.  Patient presents to the ED with c/o upper abd pain that radiated to back and chest.  Occurred around 1AM yesterday, lasted 20 min, resolved.  Second episode 1pm yesterday, 20 min resolved.  Then reocurred 4pm yesterday and has been constant since that time. Similar episode about 4 months ago, with 2 short episodes of pain, resolved and hasnt reoccurred since then. PSHx of umbilical hernia repair. Has family history of cholecystectomy in her daughter.   Assessment & Plan   Admitted earlier today by Dr. Lyda Perone. See H&P for details.  Abdominal pain/RUQ, possible secondary to cholelithiasis/choledocholithiasis -CT abdomen pelvis showed gallbladder wall thickening versus small amount of pericholecystic fluid. Similar appearance of prominent pancreatic duct and upper normal extrahepatic common bile duct. Soft tissue fullness at the uncinate process of the pancreas, probably due to adjacent nondistended duodenum. Sigmoid colon diverticular disease without acute inflammation -Right upper quadrant ultrasound showed gallstones with wall thickening.  Borderline to slightly enlarged common bile duct up to 7.5 mm. -Discussed with gastroenterology, Dr. Evette Cristal, recommended MRCP (noninvasive testing first) -MRCP showed cholelithiasis without definite evidence of acute cholecystitis.  Mild diffuse biliary ductal dilatation due to 2 tiny less than 5 mm calculi in the distal common bile duct.  Mild pancreatic ductal dilatation, without evidence of pancreatic divisum or pancreatic mass. -Continue IV fluids, n.p.o., pain control with antiemetics as needed -Started on empiric antibiotics with ciprofloxacin and Flagyl  Elevated LFTs -Likely secondary to the  above -Continue IV fluids, monitor CMP  DVT Prophylaxis  SCDs  Code Status: Full  Family Communication: None at bedside  Disposition Plan: Admitted, will consult gastroenterology formally  Consultants Gastroenterology, Dr. Evette Cristal, via phone  Procedures  RUQ [place] MRCP  Antibiotics   Anti-infectives (From admission, onward)   Start     Dose/Rate Route Frequency Ordered Stop   11/06/17 1200  ciprofloxacin (CIPRO) IVPB 400 mg     400 mg 200 mL/hr over 60 Minutes Intravenous Every 12 hours 11/06/17 0447     11/06/17 0400  metroNIDAZOLE (FLAGYL) IVPB 500 mg     500 mg 100 mL/hr over 60 Minutes Intravenous Every 8 hours 11/06/17 0359     11/06/17 0045  ciprofloxacin (CIPRO) IVPB 400 mg     400 mg 200 mL/hr over 60 Minutes Intravenous  Once 11/06/17 0040 11/06/17 0250      Subjective:   Stephania Fragmin seen and examined today.  Complains of abdominal soreness.  Recently received pain medication.  Denies current chest pain, shortness of breath, nausea or vomiting, diarrhea or constipation, dizziness or headache.  Objective:   Vitals:   11/06/17 0500 11/06/17 0535 11/06/17 0545 11/06/17 1318  BP: 120/87 120/87 120/65 127/83  Pulse:  63 (!) 51 (!) 51  Resp:  18  16  Temp:    98.3 F (36.8 C)  TempSrc:    Oral  SpO2:  96% 98% 99%  Weight:      Height:        Intake/Output Summary (Last 24 hours) at 11/06/2017 1420 Last data filed at 11/06/2017 0935 Gross per 24 hour  Intake 585.42 ml  Output 250 ml  Net 335.42 ml   Filed Weights   11/05/17 1710  Weight: 89.4 kg (  197 lb)    Exam  General: Well developed, well nourished, NAD, appears stated age  HEENT: NCAT,mucous membranes moist.   Neck: Supple  Cardiovascular: S1 S2 auscultated, no rubs, murmurs or gallops. Regular rate and rhythm.  Respiratory: Clear to auscultation bilaterally with equal chest rise  Abdomen: Soft, nontender, nondistended, + bowel sounds  Extremities: warm dry without cyanosis clubbing or  edema  Neuro: AAOx3, nonfocal  Psych: Normal affect and demeanor with intact judgement and insight   Data Reviewed: I have personally reviewed following labs and imaging studies  CBC: Recent Labs  Lab 11/05/17 1727 11/06/17 0611  WBC 13.4* 7.0  HGB 14.2 12.2  HCT 42.5 37.4  MCV 96.2 96.1  PLT 288 216   Basic Metabolic Panel: Recent Labs  Lab 11/05/17 1727 11/06/17 0611  NA 141 137  K 3.7 3.7  CL 108 109  CO2 25 21*  GLUCOSE 91 102*  BUN 12 9  CREATININE 0.92 0.54  CALCIUM 9.1 8.0*   GFR: Estimated Creatinine Clearance: 97.1 mL/min (by C-G formula based on SCr of 0.54 mg/dL). Liver Function Tests: Recent Labs  Lab 11/05/17 2230 11/06/17 0611  AST 275* 266*  ALT 153* 205*  ALKPHOS 77 72  BILITOT 2.5* 3.1*  PROT 6.9 5.3*  ALBUMIN 3.9 3.0*   Recent Labs  Lab 11/05/17 2230  LIPASE 33   No results for input(s): AMMONIA in the last 168 hours. Coagulation Profile: No results for input(s): INR, PROTIME in the last 168 hours. Cardiac Enzymes: No results for input(s): CKTOTAL, CKMB, CKMBINDEX, TROPONINI in the last 168 hours. BNP (last 3 results) No results for input(s): PROBNP in the last 8760 hours. HbA1C: No results for input(s): HGBA1C in the last 72 hours. CBG: No results for input(s): GLUCAP in the last 168 hours. Lipid Profile: No results for input(s): CHOL, HDL, LDLCALC, TRIG, CHOLHDL, LDLDIRECT in the last 72 hours. Thyroid Function Tests: No results for input(s): TSH, T4TOTAL, FREET4, T3FREE, THYROIDAB in the last 72 hours. Anemia Panel: No results for input(s): VITAMINB12, FOLATE, FERRITIN, TIBC, IRON, RETICCTPCT in the last 72 hours. Urine analysis:    Component Value Date/Time   COLORURINE AMBER (A) 11/06/2017 0944   APPEARANCEUR CLEAR 11/06/2017 0944   LABSPEC >1.046 (H) 11/06/2017 0944   PHURINE 5.0 11/06/2017 0944   GLUCOSEU NEGATIVE 11/06/2017 0944   HGBUR NEGATIVE 11/06/2017 0944   BILIRUBINUR SMALL (A) 11/06/2017 0944   KETONESUR  5 (A) 11/06/2017 0944   PROTEINUR NEGATIVE 11/06/2017 0944   UROBILINOGEN 0.2 03/12/2014 1122   NITRITE NEGATIVE 11/06/2017 0944   LEUKOCYTESUR NEGATIVE 11/06/2017 0944   Sepsis Labs: (procalcitonin:4,lacticidven:4)  )No results found for this or any previous visit (from the past 240 hour(s)).    Radiology Studies: Dg Chest 2 View  Result Date: 11/05/2017 CLINICAL DATA:  Middle chest pain EXAM: CHEST - 2 VIEW COMPARISON:  07/05/2011 FINDINGS: The heart size and mediastinal contours are within normal limits. Both lungs are clear. Minimal degenerative changes of the spine. IMPRESSION: No active cardiopulmonary disease. Electronically Signed   By: Jasmine Pang M.D.   On: 11/05/2017 18:10   Ct Abdomen Pelvis W Contrast  Result Date: 11/06/2017 CLINICAL DATA:  Upper epigastric pain elevated white count EXAM: CT ABDOMEN AND PELVIS WITH CONTRAST TECHNIQUE: Multidetector CT imaging of the abdomen and pelvis was performed using the standard protocol following bolus administration of intravenous contrast. CONTRAST:  ISOVUE-300 IOPAMIDOL (ISOVUE-300) INJECTION 61% COMPARISON:  CT 11/16/2013 FINDINGS: Lower chest: Lung bases demonstrate no acute  consolidation or pleural effusion. Heart size within normal limits. Small esophageal hiatal hernia. Hepatobiliary: Focal fat infiltration near the falciform ligament. Small cyst near the gallbladder fossa. Appearance of mild periportal edema. Gallbladder wall thickening versus small amount of pericholecystic fluid. No calcified gallstones. Extrahepatic bile duct upper normal. Pancreas: No inflammatory changes. Similar appearance of proximal pancreatic duct dilatation. Soft tissue fullness at the uncinate process and head of pancreas. Spleen: Normal in size without focal abnormality. Adrenals/Urinary Tract: Adrenal glands are unremarkable. Kidneys are normal, without renal calculi, focal lesion, or hydronephrosis. Bladder is unremarkable. Stomach/Bowel:  Stomach is within normal limits. Appendix appears normal. No evidence of bowel wall thickening, distention, or inflammatory changes. 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 No enlarged abdominal or pelvic lymph nodes. Reproductive: Uterus and bilateral adnexa are unremarkable. Other: Negative for free air or free fluid. Musculoskeletal: No acute or significant osseous findings. IMPRESSION: 1. Gallbladder wall thickening versus small amount of pericholecystic fluid. Suggest correlation with right upper quadrant abdominal ultrasound. 2. Similar appearance of prominent pancreatic duct and upper normal extrahepatic common bile duct. Soft tissue fullness at the uncinate process of the pancreas, probably due to adjacent nondistended duodenum. 3. Sigmoid colon diverticular disease without acute inflammation Electronically Signed   By: Jasmine Pang M.D.   On: 11/06/2017 00:36   Mr 3d Recon At Scanner  Result Date: 11/06/2017 CLINICAL DATA:  Abdominal pain and nausea. Elevated liver function tests. Cholelithiasis. Mild biliary and pancreatic ductal dilatation on recent ultrasound and CT. EXAM: MRI ABDOMEN WITHOUT AND WITH CONTRAST (INCLUDING MRCP) TECHNIQUE: Multiplanar multisequence MR imaging of the abdomen was performed both before and after the administration of intravenous contrast. Heavily T2-weighted images of the biliary and pancreatic ducts were obtained, and three-dimensional MRCP images were rendered by post processing. CONTRAST:  18mL MULTIHANCE GADOBENATE DIMEGLUMINE 529 MG/ML IV SOLN COMPARISON:  Ultrasound on 11/06/2017 and CT on 11/05/2017 FINDINGS: Lower chest: No acute findings. Hepatobiliary: No hepatic masses identified. Tiny approximately 1 cm cyst is seen in the left lobe adjacent to the gallbladder fossa. Tiny gallstones are noted, without significant gallbladder wall thickening or pericholecystic inflammatory  changes. Mild biliary ductal dilatation seen with common bile duct measuring 7 mm. Two tiny less than 5 calculi are seen in the distal common bile duct. Pancreas: No mass or inflammatory changes. Mild pancreatic ductal dilatation is seen in the pancreatic head measuring up to 4 mm. No evidence of pancreas divisum. Spleen:  Within normal limits in size and appearance. Adrenals/Urinary Tract: No masses identified. No evidence of hydronephrosis. Stomach/Bowel: Visualized portion unremarkable. Vascular/Lymphatic: No pathologically enlarged lymph nodes identified. No abdominal aortic aneurysm. Other:  None. Musculoskeletal:  No suspicious bone lesions identified. IMPRESSION: Cholelithiasis, without definite evidence of acute cholecystitis. Mild diffuse biliary ductal dilatation due to 2 tiny less than 5 mm calculi in the distal common bile duct. Mild pancreatic ductal dilatation, without evidence of pancreas divisum or pancreatic mass. Electronically Signed   By: Myles Rosenthal M.D.   On: 11/06/2017 13:48   Mr Abdomen Mrcp Vivien Rossetti Contast  Result Date: 11/06/2017 CLINICAL DATA:  Abdominal pain and nausea. Elevated liver function tests. Cholelithiasis. Mild biliary and pancreatic ductal dilatation on recent ultrasound and CT. EXAM: MRI ABDOMEN WITHOUT AND WITH CONTRAST (INCLUDING MRCP) TECHNIQUE: Multiplanar multisequence MR imaging of the abdomen was performed both before and after the administration of intravenous contrast. Heavily T2-weighted images of the biliary and pancreatic ducts were obtained, and three-dimensional MRCP images  were rendered by post processing. CONTRAST:  18mL MULTIHANCE GADOBENATE DIMEGLUMINE 529 MG/ML IV SOLN COMPARISON:  Ultrasound on 11/06/2017 and CT on 11/05/2017 FINDINGS: Lower chest: No acute findings. Hepatobiliary: No hepatic masses identified. Tiny approximately 1 cm cyst is seen in the left lobe adjacent to the gallbladder fossa. Tiny gallstones are noted, without significant gallbladder  wall thickening or pericholecystic inflammatory changes. Mild biliary ductal dilatation seen with common bile duct measuring 7 mm. Two tiny less than 5 calculi are seen in the distal common bile duct. Pancreas: No mass or inflammatory changes. Mild pancreatic ductal dilatation is seen in the pancreatic head measuring up to 4 mm. No evidence of pancreas divisum. Spleen:  Within normal limits in size and appearance. Adrenals/Urinary Tract: No masses identified. No evidence of hydronephrosis. Stomach/Bowel: Visualized portion unremarkable. Vascular/Lymphatic: No pathologically enlarged lymph nodes identified. No abdominal aortic aneurysm. Other:  None. Musculoskeletal:  No suspicious bone lesions identified. IMPRESSION: Cholelithiasis, without definite evidence of acute cholecystitis. Mild diffuse biliary ductal dilatation due to 2 tiny less than 5 mm calculi in the distal common bile duct. Mild pancreatic ductal dilatation, without evidence of pancreas divisum or pancreatic mass. Electronically Signed   By: Myles Rosenthal M.D.   On: 11/06/2017 13:48   US Abdomen Limited Ruq  Result Date: 11/06/2017 CLINICAL DATA:  Abnormal CT with right upper quadrant pain EXAM: ULTRASOUND ABDOMEN LIMITED RIGHT UPPER QUADRANT COMPARISON:  CT 11/05/2017 FINDINGS: Gallbladder: Multiple small stones measuring up to 4.5 mm. Increased wall thickness at 4 mm. Negative sonographic Murphy. Common bile duct: Diameter: Borderline to slightly enlarged measuring up to 7.5 mm. Liver: Small cyst adjacent to the gallbladder measuring 1.2 cm. Liver otherwise normal. Portal vein is patent on color Doppler imaging with normal direction of blood flow towards the liver. IMPRESSION: 1. Gallstones with wall thickening but negative sonographic Eulah Pont, findings are equivocal for cholecystitis. If this remains a clinical concern, further evaluation with nuclear medicine hepatobiliary imaging could be obtained to further evaluate 2. Borderline to slightly  enlarged common bile duct up to 7.5 mm. Recommend correlation with LFTs with follow-up MRCP as indicated. Electronically Signed   By: Jasmine Pang M.D.   On: 11/06/2017 01:35     Scheduled Meds: Continuous Infusions: . sodium chloride 125 mL/hr at 11/06/17 0935  . ciprofloxacin    . metronidazole 500 mg (11/06/17 1319)     LOS: 0 days   Time Spent in minutes   30 minutes  Tish Begin D.O. on 11/06/2017 at 2:20 PM  Between 7am to 7pm - Pager - [phone]  After 7pm go to [email or web address] - password [email or web address]  And look for the night coverage person covering for me after hours  [facility] Office  [phone]

## 2017-11-07 ENCOUNTER — Inpatient Hospital Stay (HOSPITAL_COMMUNITY): Payer: Self-pay | Admitting: Anesthesiology

## 2017-11-07 ENCOUNTER — Encounter (HOSPITAL_COMMUNITY)
Admission: EM | Disposition: A | Discharge status: Home / Self Care | Payer: Self-pay | Source: Home / Self Care | Attending: Internal Medicine

## 2017-11-07 ENCOUNTER — Encounter (HOSPITAL_COMMUNITY): Payer: Self-pay

## 2017-11-07 ENCOUNTER — Inpatient Hospital Stay (HOSPITAL_COMMUNITY)
Admit: 2017-11-07 | Discharge: 2017-11-07 | Disposition: A | Discharge status: Home / Self Care | Payer: Self-pay | Attending: Gastroenterology | Admitting: Gastroenterology

## 2017-11-07 DIAGNOSIS — R001 Bradycardia, unspecified: Secondary | ICD-10-CM

## 2017-11-07 HISTORY — PX: ERCP: SHX5425

## 2017-11-07 LAB — COMPREHENSIVE METABOLIC PANEL
ALBUMIN: 3.2 g/dL — AB (ref 3.5–5.0)
ALT: 155 U/L — ABNORMAL HIGH (ref 14–54)
AST: 80 U/L — AB (ref 15–41)
Alkaline Phosphatase: 81 U/L (ref 38–126)
Anion gap: 6 (ref 5–15)
BUN: 6 mg/dL (ref 6–20)
CHLORIDE: 111 mmol/L (ref 101–111)
CO2: 23 mmol/L (ref 22–32)
CREATININE: 0.79 mg/dL (ref 0.44–1.00)
Calcium: 8.2 mg/dL — ABNORMAL LOW (ref 8.9–10.3)
GFR calc Af Amer: >60 mL/min (ref >60)
GLUCOSE: 96 mg/dL (ref 65–99)
POTASSIUM: 3.7 mmol/L (ref 3.5–5.1)
SODIUM: 140 mmol/L (ref 135–145)
Total Bilirubin: 0.9 mg/dL (ref 0.3–1.2)
Total Protein: 5.5 g/dL — ABNORMAL LOW (ref 6.5–8.1)

## 2017-11-07 LAB — URINE CULTURE: Culture: NO GROWTH

## 2017-11-07 LAB — MRSA PCR SCREENING: MRSA by PCR: NEGATIVE

## 2017-11-07 LAB — TSH: TSH: 3.22 u[IU]/mL (ref 0.350–4.500)

## 2017-11-07 SURGERY — ERCP, WITH INTERVENTION IF INDICATED
Anesthesia: General

## 2017-11-07 MED ORDER — INDOMETHACIN 50 MG RE SUPP
RECTAL | Status: AC
  Filled 2017-11-07: qty 1

## 2017-11-07 MED ORDER — CHLORHEXIDINE GLUCONATE CLOTH 2 % EX PADS
6.0000 | MEDICATED_PAD | Freq: Once | CUTANEOUS | Status: AC
  Administered 2017-11-07: 6 via TOPICAL

## 2017-11-07 MED ORDER — MORPHINE SULFATE (PF) 4 MG/ML IV SOLN
2.0000 mg | INTRAVENOUS | Status: DC | PRN
  Administered 2017-11-07 – 2017-11-08 (×4): 4 mg via INTRAVENOUS
  Filled 2017-11-07 (×4): qty 1

## 2017-11-07 MED ORDER — MIDAZOLAM HCL 2 MG/2ML IJ SOLN
INTRAMUSCULAR | Status: AC
  Filled 2017-11-07: qty 2

## 2017-11-07 MED ORDER — ONDANSETRON HCL 4 MG/2ML IJ SOLN
INTRAMUSCULAR | Status: DC | PRN
  Administered 2017-11-07: 4 mg via INTRAVENOUS

## 2017-11-07 MED ORDER — SUGAMMADEX SODIUM 200 MG/2ML IV SOLN
INTRAVENOUS | Status: DC | PRN
  Administered 2017-11-07: 200 mg via INTRAVENOUS

## 2017-11-07 MED ORDER — INDOMETHACIN 50 MG RE SUPP
RECTAL | Status: DC | PRN
  Administered 2017-11-07: 100 mg via RECTAL

## 2017-11-07 MED ORDER — SODIUM CHLORIDE 0.9 % IV SOLN
INTRAVENOUS | Status: DC | PRN
  Administered 2017-11-07: 25 mL

## 2017-11-07 MED ORDER — MIDAZOLAM HCL 5 MG/5ML IJ SOLN
INTRAMUSCULAR | Status: DC | PRN
  Administered 2017-11-07: 2 mg via INTRAVENOUS

## 2017-11-07 MED ORDER — PROPOFOL 10 MG/ML IV BOLUS
INTRAVENOUS | Status: DC | PRN
  Administered 2017-11-07: 200 mg via INTRAVENOUS

## 2017-11-07 MED ORDER — LIDOCAINE HCL (CARDIAC) PF 100 MG/5ML IV SOSY
PREFILLED_SYRINGE | INTRAVENOUS | Status: DC | PRN
  Administered 2017-11-07: 80 mg via INTRAVENOUS

## 2017-11-07 MED ORDER — ROCURONIUM BROMIDE 100 MG/10ML IV SOLN
INTRAVENOUS | Status: DC | PRN
  Administered 2017-11-07: 40 mg via INTRAVENOUS

## 2017-11-07 MED ORDER — SODIUM CHLORIDE 0.9 % IV SOLN: INTRAVENOUS | Status: DC

## 2017-11-07 MED ORDER — HEPARIN SODIUM (PORCINE) 5000 UNIT/ML IJ SOLN: 5000.0000 [IU] | Freq: Once | INTRAMUSCULAR | Status: DC

## 2017-11-07 MED ORDER — FENTANYL CITRATE (PF) 100 MCG/2ML IJ SOLN
INTRAMUSCULAR | Status: AC
  Filled 2017-11-07: qty 2

## 2017-11-07 MED ORDER — CHLORHEXIDINE GLUCONATE CLOTH 2 % EX PADS: 6.0000 | MEDICATED_PAD | Freq: Once | CUTANEOUS | Status: DC

## 2017-11-07 MED ORDER — PROPOFOL 10 MG/ML IV BOLUS
INTRAVENOUS | Status: AC
  Filled 2017-11-07: qty 20

## 2017-11-07 MED ORDER — DEXAMETHASONE SODIUM PHOSPHATE 10 MG/ML IJ SOLN
INTRAMUSCULAR | Status: DC | PRN
  Administered 2017-11-07: 10 mg via INTRAVENOUS

## 2017-11-07 MED ORDER — FENTANYL CITRATE (PF) 100 MCG/2ML IJ SOLN
INTRAMUSCULAR | Status: DC | PRN
  Administered 2017-11-07: 100 ug via INTRAVENOUS

## 2017-11-07 MED ORDER — GLUCAGON HCL RDNA (DIAGNOSTIC) 1 MG IJ SOLR
INTRAMUSCULAR | Status: AC
  Filled 2017-11-07: qty 1

## 2017-11-07 MED ORDER — LACTATED RINGERS IV SOLN
INTRAVENOUS | Status: DC | PRN
  Administered 2017-11-07: 13:00:00 via INTRAVENOUS

## 2017-11-07 NOTE — H&P (Signed)
CC: RUQ and MEG abdominal pain x2-3 months, intermittent, consult by Dr. Ree Kida  HPI: Courtney Clements is an 47 y.o. female with hx of GERD and umbilical hernia presented to the ED yesterday with 1d of MEG/RUQ pain radiating to her back. Has had 6 episodes of this in the past over the last 2-3 months. This one was the most severe. Nothing makes pain better nor worse. Associated nausea and emesis with it. Pain was sharp and unremitting. Denies fever/chills.  Past Medical History:  Diagnosis Date  . Arthritis   . Chronic constipation    taking Metamucil as needed  . Diarrhea   . Ehlers-Danlos disease   . Family history of anesthesia complication    pts mom has nausea and b/p drops  . GERD (gastroesophageal reflux disease)    occasionally but doesn't take anything  . Hemorrhoids   . History of bronchitis    couple of yrs ago  . History of migraine    last one at least 4-78month ago  . Joint pain   . Pneumonia 2002  . PONV (postoperative nausea and vomiting)    b/p drops low and nausea  . Seasonal allergies    takes Allegra daily and uses Flonase   . Umbilical hernia     Past Surgical History:  Procedure Laterality Date  . CESAREAN SECTION  2008  . INSERTION OF MESH N/A 12/08/2013   Procedure: INSERTION OF MESH;  Surgeon: ARalene Ok MD;  [facility]  Service: General;  Laterality: N/A;  . KNEE SURGERY Left    x 2  . TUBAL LIGATION    . UMBILICAL HERNIA REPAIR N/A 12/08/2013   Procedure: LAPAROSCOPIC UMBILICAL HERNIA;  Surgeon: ARalene Ok MD;  [facility]  Service: General;  Laterality: N/A;  PSH: Lap umbilical hernia repair with mesh (Dr. RRosendo Gros 08/2013); C-sx x1 via Pfannenstiel with BTL at same time; ACL surgery  Family History  Problem Relation Age of Onset  . Cholecystitis Daughter        Had cholecystectomy    Social:  reports that she has quit smoking. She has never used smokeless tobacco. She reports that she drinks alcohol. She reports that she  does not use drugs. Reformed smoker - quit in 2013. Social EtOH use, not daily.  Allergies:  Allergies  Allergen Reactions  . Penicillins Anaphylaxis    Has patient had a PCN reaction causing immediate rash, facial/tongue/throat swelling, SOB or lightheadedness with hypotension: Yes Has patient had a PCN reaction causing severe rash involving mucus membranes or skin necrosis: No Has patient had a PCN reaction that required hospitalization: No Has patient had a PCN reaction occurring within the last 10 years: No If all of the above answers are "NO", then may proceed with Cephalosporin use.   . Gadolinium Derivatives Hives, Itching and Swelling    immediatly after contrast injection pt with sneezing and oral swelling. Light chest tightness. Pt was evaluated by Dr EIlda Foiland administered 266mof benadryl IV.   . Adhesive [Tape]     Rash /break out  . Contrast Media [Iodinated Diagnostic Agents] Swelling    Swelling, hives, itching   . Latex Other (See Comments)    irritation  . Coconut Oil Rash  . Hibiclens [Chlorhexidine Gluconate] Rash    Medications: I have reviewed the patient's current medications.  Results for orders placed or performed during the hospital encounter of 11/05/17 (from the past 48 hour(s))  Basic metabolic panel     Status:  None   Collection Time: 11/05/17  5:27 PM  Result Value Ref Range   Sodium 141 135 - 145 mmol/L   Potassium 3.7 3.5 - 5.1 mmol/L   Chloride 108 101 - 111 mmol/L   CO2 25 22 - 32 mmol/L   Glucose, Bld 91 65 - 99 mg/dL   BUN 12 6 - 20 mg/dL   Creatinine, Ser 0.92 0.44 - 1.00 mg/dL   Calcium 9.1 8.9 - 10.3 mg/dL   GFR calc non Af Amer >60 >60 mL/min   GFR calc Af Amer >60 >60 mL/min    Comment: (NOTE) The eGFR has been calculated using the CKD EPI equation. This calculation has not been validated in all clinical situations. eGFR's persistently <60 mL/min signify possible Chronic Kidney Disease.    Anion gap 8 5 - 15    Comment:  Performed at [facility], [place] [address] [place], [place] [address]  CBC     Status: Abnormal   Collection Time: 11/05/17  5:27 PM  Result Value Ref Range   WBC 13.4 (H) 4.0 - 10.5 K/uL   RBC 4.42 3.87 - 5.11 MIL/uL   Hemoglobin 14.2 12.0 - 15.0 g/dL   HCT 42.5 36.0 - 46.0 %   MCV 96.2 78.0 - 100.0 fL   MCH 32.1 26.0 - 34.0 pg   MCHC 33.4 30.0 - 36.0 g/dL   RDW 12.6 11.5 - 15.5 %   Platelets 288 150 - 400 K/uL    Comment: Performed at [facility], [place] [address], [place], [place] [address]  I-stat troponin, ED     Status: None   Collection Time: 11/05/17  5:31 PM  Result Value Ref Range   Troponin i, poc 0.00 0.00 - 0.08 ng/mL   Comment 3            Comment: Due to the release kinetics of cTnI, a negative result within the first hours of the onset of symptoms does not rule out myocardial infarction with certainty. If myocardial infarction is still suspected, repeat the test at appropriate intervals.   I-Stat beta hCG blood, ED     Status: None   Collection Time: 11/05/17  5:32 PM  Result Value Ref Range   I-stat hCG, quantitative <5.0 <5 mIU/mL   Comment 3            Comment:   GEST. AGE      CONC.  (mIU/mL)   <=1 WEEK        5 - 50     2 WEEKS       50 - 500     3 WEEKS       100 - 10,000     4 WEEKS     1,000 - 30,000        FEMALE AND NON-PREGNANT FEMALE:     LESS THAN 5 mIU/mL   Hepatic function panel     Status: Abnormal   Collection Time: 11/05/17 10:30 PM  Result Value Ref Range   Total Protein 6.9 6.5 - 8.1 g/dL   Albumin 3.9 3.5 - 5.0 g/dL   AST 275 (H) 15 - 41 U/L   ALT 153 (H) 14 - 54 U/L   Alkaline Phosphatase 77 38 - 126 U/L   Total Bilirubin 2.5 (H) 0.3 - 1.2 mg/dL   Bilirubin, Direct 1.3 (H) 0.1 - 0.5 mg/dL   Indirect Bilirubin 1.2 (H) 0.3 - 0.9 mg/dL    Comment: Performed at  [facility], [place] [address], [place], [place] [address]  Lipase, blood     Status: None   Collection Time: 11/05/17  10:30 PM  Result Value Ref Range   Lipase 33 11 - 51 U/L    Comment: Performed at [facility], [place] [address] [place], [place] [address]  I-stat troponin, ED     Status: None   Collection Time: 11/06/17 12:07 AM  Result Value Ref Range   Troponin i, poc 0.00 0.00 - 0.08 ng/mL   Comment 3            Comment: Due to the release kinetics of cTnI, a negative result within the first hours of the onset of symptoms does not rule out myocardial infarction with certainty. If myocardial infarction is still suspected, repeat the test at appropriate intervals.   Comprehensive metabolic panel     Status: Abnormal   Collection Time: 11/06/17  6:11 AM  Result Value Ref Range   Sodium 137 135 - 145 mmol/L   Potassium 3.7 3.5 - 5.1 mmol/L   Chloride 109 101 - 111 mmol/L   CO2 21 (L) 22 - 32 mmol/L   Glucose, Bld 102 (H) 65 - 99 mg/dL   BUN 9 6 - 20 mg/dL   Creatinine, Ser 0.54 0.44 - 1.00 mg/dL   Calcium 8.0 (L) 8.9 - 10.3 mg/dL   Total Protein 5.3 (L) 6.5 - 8.1 g/dL   Albumin 3.0 (L) 3.5 - 5.0 g/dL   AST 266 (H) 15 - 41 U/L   ALT 205 (H) 14 - 54 U/L   Alkaline Phosphatase 72 38 - 126 U/L   Total Bilirubin 3.1 (H) 0.3 - 1.2 mg/dL   GFR calc non Af Amer >60 >60 mL/min   GFR calc Af Amer >60 >60 mL/min    Comment: (NOTE) The eGFR has been calculated using the CKD EPI equation. This calculation has not been validated in all clinical situations. eGFR's persistently <60 mL/min signify possible Chronic Kidney Disease.    Anion gap 7 5 - 15    Comment: Performed at [facility], [place] [address] [place], [place] [address]  HIV antibody (Routine Testing)     Status: None   Collection Time: 11/06/17  6:11 AM  Result Value Ref Range   HIV Screen 4th Generation wRfx Non Reactive Non Reactive    Comment: (NOTE) Performed At: [facility] [place], [place] [id number] Rush Farmer MD [id number] Performed at [facility], [place] [address] [place], [place] [address]   CBC     Status: None   Collection Time: 11/06/17  6:11 AM  Result Value Ref Range   WBC 7.0 4.0 - 10.5 K/uL   RBC 3.89 3.87 - 5.11 MIL/uL   Hemoglobin 12.2 12.0 - 15.0 g/dL   HCT 37.4 36.0 - 46.0 %   MCV 96.1 78.0 - 100.0 fL   MCH 31.4 26.0 - 34.0 pg   MCHC 32.6 30.0 - 36.0 g/dL   RDW 12.7 11.5 - 15.5 %   Platelets 216 150 - 400 K/uL    Comment: Performed at [facility], [place] [address], [place], [place] [address]  Culture, Urine     Status: None   Collection Time: 11/06/17  9:44 AM  Result Value Ref Range   Specimen Description      URINE, CLEAN CATCH Performed at [facility], [place] [address], [place],  [address]  Special Requests      NONE Performed at [facility], [place] [address], [place], [place] [address]    Culture      NO GROWTH Performed at [facility], [place] [address], [place], [place] [address]    Report Status 11/07/2017 FINAL   Urinalysis, Routine w reflex microscopic     Status: Abnormal   Collection Time: 11/06/17  9:44 AM  Result Value Ref Range   Color, Urine AMBER (A) YELLOW    Comment: BIOCHEMICALS MAY BE AFFECTED BY COLOR   APPearance CLEAR CLEAR   Specific Gravity, Urine >1.046 (H) 1.005 - 1.030   pH 5.0 5.0 - 8.0   Glucose, UA NEGATIVE NEGATIVE mg/dL   Hgb urine dipstick NEGATIVE NEGATIVE   Bilirubin Urine SMALL (A) NEGATIVE   Ketones, ur 5 (A) NEGATIVE mg/dL   Protein, ur NEGATIVE NEGATIVE mg/dL   Nitrite NEGATIVE NEGATIVE   Leukocytes, UA NEGATIVE NEGATIVE    Comment: Performed at [facility], [place] [address], [place], [place] [address]  Comprehensive metabolic panel     Status: Abnormal   Collection Time: 11/07/17  4:06 AM  Result Value Ref Range   Sodium 140 135 - 145 mmol/L   Potassium 3.7 3.5 - 5.1 mmol/L   Chloride 111 101 - 111 mmol/L   CO2 23 22 - 32 mmol/L   Glucose, Bld 96 65 - 99 mg/dL    BUN 6 6 - 20 mg/dL   Creatinine, Ser 0.79 0.44 - 1.00 mg/dL   Calcium 8.2 (L) 8.9 - 10.3 mg/dL   Total Protein 5.5 (L) 6.5 - 8.1 g/dL   Albumin 3.2 (L) 3.5 - 5.0 g/dL   AST 80 (H) 15 - 41 U/L   ALT 155 (H) 14 - 54 U/L   Alkaline Phosphatase 81 38 - 126 U/L   Total Bilirubin 0.9 0.3 - 1.2 mg/dL   GFR calc non Af Amer >60 >60 mL/min   GFR calc Af Amer >60 >60 mL/min    Comment: (NOTE) The eGFR has been calculated using the CKD EPI equation. This calculation has not been validated in all clinical situations. eGFR's persistently <60 mL/min signify possible Chronic Kidney Disease.    Anion gap 6 5 - 15    Comment: Performed at [facility], [place] [address] [place], [place] [address]  TSH     Status: None   Collection Time: 11/07/17 10:27 AM  Result Value Ref Range   TSH 3.220 0.350 - 4.500 uIU/mL    Comment: Performed by a 3rd Generation assay with a functional sensitivity of <=0.01 uIU/mL. Performed at [facility] [place] [facility], [place] [address] [place],  [address]     Dg Chest 2 View  Result Date: 11/05/2017 CLINICAL DATA:  Middle chest pain EXAM: CHEST - 2 VIEW COMPARISON:  07/05/2011 FINDINGS: The heart size and mediastinal contours are within normal limits. Both lungs are clear. Minimal degenerative changes of the spine. IMPRESSION: No active cardiopulmonary disease. Electronically Signed   By: Donavan Foil M.D.   On: 11/05/2017 18:10   Ct Abdomen Pelvis W Contrast  Result Date: 11/06/2017 CLINICAL DATA:  Upper epigastric pain elevated Airam Heidecker count EXAM: CT ABDOMEN AND PELVIS WITH CONTRAST TECHNIQUE: Multidetector CT imaging of the abdomen and pelvis was performed using the standard protocol following bolus administration of intravenous contrast. CONTRAST:  181m ISOVUE-300 IOPAMIDOL (ISOVUE-300) INJECTION 61% COMPARISON:  CT 11/16/2013 FINDINGS: Lower chest: Lung bases demonstrate no acute consolidation or  pleural effusion. Heart size within normal  limits. Small esophageal hiatal hernia. Hepatobiliary: Focal fat infiltration near the falciform ligament. Small cyst near the gallbladder fossa. Appearance of mild periportal edema. Gallbladder wall thickening versus small amount of pericholecystic fluid. No calcified gallstones. Extrahepatic bile duct upper normal. Pancreas: No inflammatory changes. Similar appearance of proximal pancreatic duct dilatation. Soft tissue fullness at the uncinate process and head of pancreas. Spleen: Normal in size without focal abnormality. Adrenals/Urinary Tract: Adrenal glands are unremarkable. Kidneys are normal, without renal calculi, focal lesion, or hydronephrosis. Bladder is unremarkable. Stomach/Bowel: Stomach is within normal limits. Appendix appears normal. No evidence of bowel wall thickening, distention, or inflammatory changes. 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 No enlarged abdominal or pelvic lymph nodes. Reproductive: Uterus and bilateral adnexa are unremarkable. Other: Negative for free air or free fluid. Musculoskeletal: No acute or significant osseous findings. IMPRESSION: 1. Gallbladder wall thickening versus small amount of pericholecystic fluid. Suggest correlation with right upper quadrant abdominal ultrasound. 2. Similar appearance of prominent pancreatic duct and upper normal extrahepatic common bile duct. Soft tissue fullness at the uncinate process of the pancreas, probably due to adjacent nondistended duodenum. 3. Sigmoid colon diverticular disease without acute inflammation Electronically Signed   By: Donavan Foil M.D.   On: 11/06/2017 00:36   Mr 3d Recon At Scanner  Result Date: 11/06/2017 CLINICAL DATA:  Abdominal pain and nausea. Elevated liver function tests. Cholelithiasis. Mild biliary and pancreatic ductal dilatation on recent ultrasound and CT. EXAM: MRI ABDOMEN WITHOUT AND WITH CONTRAST (INCLUDING  MRCP) TECHNIQUE: Multiplanar multisequence MR imaging of the abdomen was performed both before and after the administration of intravenous contrast. Heavily T2-weighted images of the biliary and pancreatic ducts were obtained, and three-dimensional MRCP images were rendered by post processing. CONTRAST:  76m MULTIHANCE GADOBENATE DIMEGLUMINE 529 MG/ML IV SOLN COMPARISON:  Ultrasound on 11/06/2017 and CT on 11/05/2017 FINDINGS: Lower chest: No acute findings. Hepatobiliary: No hepatic masses identified. Tiny approximately 1 cm cyst is seen in the left lobe adjacent to the gallbladder fossa. Tiny gallstones are noted, without significant gallbladder wall thickening or pericholecystic inflammatory changes. Mild biliary ductal dilatation seen with common bile duct measuring 7 mm. Two tiny less than 5 calculi are seen in the distal common bile duct. Pancreas: No mass or inflammatory changes. Mild pancreatic ductal dilatation is seen in the pancreatic head measuring up to 4 mm. No evidence of pancreas divisum. Spleen:  Within normal limits in size and appearance. Adrenals/Urinary Tract: No masses identified. No evidence of hydronephrosis. Stomach/Bowel: Visualized portion unremarkable. Vascular/Lymphatic: No pathologically enlarged lymph nodes identified. No abdominal aortic aneurysm. Other:  None. Musculoskeletal:  No suspicious bone lesions identified. IMPRESSION: Cholelithiasis, without definite evidence of acute cholecystitis. Mild diffuse biliary ductal dilatation due to 2 tiny less than 5 mm calculi in the distal common bile duct. Mild pancreatic ductal dilatation, without evidence of pancreas divisum or pancreatic mass. Electronically Signed   By: JEarle GellM.D.   On: 11/06/2017 13:48   Mr Abdomen Mrcp WMoise BoringContast  Result Date: 11/06/2017 CLINICAL DATA:  Abdominal pain and nausea. Elevated liver function tests. Cholelithiasis. Mild biliary and pancreatic ductal dilatation on recent ultrasound and CT. EXAM:  MRI ABDOMEN WITHOUT AND WITH CONTRAST (INCLUDING MRCP) TECHNIQUE: Multiplanar multisequence MR imaging of the abdomen was performed both before and after the administration of intravenous contrast. Heavily T2-weighted images of the biliary and pancreatic ducts were obtained, and three-dimensional MRCP images were  rendered by post processing. CONTRAST:  109m MULTIHANCE GADOBENATE DIMEGLUMINE 529 MG/ML IV SOLN COMPARISON:  Ultrasound on 11/06/2017 and CT on 11/05/2017 FINDINGS: Lower chest: No acute findings. Hepatobiliary: No hepatic masses identified. Tiny approximately 1 cm cyst is seen in the left lobe adjacent to the gallbladder fossa. Tiny gallstones are noted, without significant gallbladder wall thickening or pericholecystic inflammatory changes. Mild biliary ductal dilatation seen with common bile duct measuring 7 mm. Two tiny less than 5 calculi are seen in the distal common bile duct. Pancreas: No mass or inflammatory changes. Mild pancreatic ductal dilatation is seen in the pancreatic head measuring up to 4 mm. No evidence of pancreas divisum. Spleen:  Within normal limits in size and appearance. Adrenals/Urinary Tract: No masses identified. No evidence of hydronephrosis. Stomach/Bowel: Visualized portion unremarkable. Vascular/Lymphatic: No pathologically enlarged lymph nodes identified. No abdominal aortic aneurysm. Other:  None. Musculoskeletal:  No suspicious bone lesions identified. IMPRESSION: Cholelithiasis, without definite evidence of acute cholecystitis. Mild diffuse biliary ductal dilatation due to 2 tiny less than 5 mm calculi in the distal common bile duct. Mild pancreatic ductal dilatation, without evidence of pancreas divisum or pancreatic mass. Electronically Signed   By: JEarle GellM.D.   On: 11/06/2017 13:48   UKoreaAbdomen Limited Ruq  Result Date: 11/06/2017 CLINICAL DATA:  Abnormal CT with right upper quadrant pain EXAM: ULTRASOUND ABDOMEN LIMITED RIGHT UPPER QUADRANT COMPARISON:  CT  11/05/2017 FINDINGS: Gallbladder: Multiple small stones measuring up to 4.5 mm. Increased wall thickness at 4 mm. Negative sonographic Murphy. Common bile duct: Diameter: Borderline to slightly enlarged measuring up to 7.5 mm. Liver: Small cyst adjacent to the gallbladder measuring 1.2 cm. Liver otherwise normal. Portal vein is patent on color Doppler imaging with normal direction of blood flow towards the liver. IMPRESSION: 1. Gallstones with wall thickening but negative sonographic MPercell Miller findings are equivocal for cholecystitis. If this remains a clinical concern, further evaluation with nuclear medicine hepatobiliary imaging could be obtained to further evaluate 2. Borderline to slightly enlarged common bile duct up to 7.5 mm. Recommend correlation with LFTs with follow-up MRCP as indicated. Electronically Signed   By: KDonavan FoilM.D.   On: 11/06/2017 01:35    ROS - all of the below systems have been reviewed with the patient and positives are indicated with bold text General: chills, fever or night sweats Eyes: blurry vision or double vision ENT: epistaxis or sore throat Allergy/Immunology: itchy/watery eyes or nasal congestion Hematologic/Lymphatic: bleeding problems, blood clots or swollen lymph nodes Endocrine: temperature intolerance or unexpected weight changes Breast: new or changing breast lumps or nipple discharge Resp: cough, shortness of breath, or wheezing CV: chest pain or dyspnea on exertion GI: as per HPI GU: dysuria, trouble voiding, or hematuria MSK: joint pain or joint stiffness Neuro: TIA or stroke symptoms Derm: pruritus and skin lesion changes Psych: anxiety and depression  PE Blood pressure (!) 146/98, pulse 61, temperature 98.8 F (37.1 C), temperature source Oral, resp. rate 20, height 5' 5"  (1.651 m), weight 89.4 kg (197 lb), last menstrual period 11/01/2017, SpO2 100 %. Constitutional: NAD; conversant; no deformities Eyes: Moist conjunctiva; no lid lag;  anicteric; PERRL Neck: Trachea midline; no thyromegaly Lungs: Normal respiratory effort; no tactile fremitus CV: RRR; no palpable thrills; no pitting edema GI: Abd soft, mildly ttp in RUQ, negative Murphy's sign; nondistended; no palpable hepatosplenomegaly. Small reducible umbilical hernia (smaller than breadth of my index finger) MSK: Normal gait; no clubbing/cyanosis Psychiatric: Appropriate affect; alert and oriented x3 Lymphatic: No  palpable cervical or axillary lymphadenopathy  Results for orders placed or performed during the hospital encounter of 11/05/17 (from the past 48 hour(s))  Basic metabolic panel     Status: None   Collection Time: 11/05/17  5:27 PM  Result Value Ref Range   Sodium 141 135 - 145 mmol/L   Potassium 3.7 3.5 - 5.1 mmol/L   Chloride 108 101 - 111 mmol/L   CO2 25 22 - 32 mmol/L   Glucose, Bld 91 65 - 99 mg/dL   BUN 12 6 - 20 mg/dL   Creatinine, Ser 0.92 0.44 - 1.00 mg/dL   Calcium 9.1 8.9 - 10.3 mg/dL   GFR calc non Af Amer >60 >60 mL/min   GFR calc Af Amer >60 >60 mL/min    Comment: (NOTE) The eGFR has been calculated using the CKD EPI equation. This calculation has not been validated in all clinical situations. eGFR's persistently <60 mL/min signify possible Chronic Kidney Disease.    Anion gap 8 5 - 15    Comment: Performed at [facility], [place] [address], [place], [place] [address]  CBC     Status: Abnormal   Collection Time: 11/05/17  5:27 PM  Result Value Ref Range   WBC 13.4 (H) 4.0 - 10.5 K/uL   RBC 4.42 3.87 - 5.11 MIL/uL   Hemoglobin 14.2 12.0 - 15.0 g/dL   HCT 42.5 36.0 - 46.0 %   MCV 96.2 78.0 - 100.0 fL   MCH 32.1 26.0 - 34.0 pg   MCHC 33.4 30.0 - 36.0 g/dL   RDW 12.6 11.5 - 15.5 %   Platelets 288 150 - 400 K/uL    Comment: Performed at [facility], [place] [address] [place],  [address]  I-stat troponin, ED     Status: None   Collection Time: 11/05/17  5:31 PM  Result Value Ref Range    Troponin i, poc 0.00 0.00 - 0.08 ng/mL   Comment 3            Comment: Due to the release kinetics of cTnI, a negative result within the first hours of the onset of symptoms does not rule out myocardial infarction with certainty. If myocardial infarction is still suspected, repeat the test at appropriate intervals.   I-Stat beta hCG blood, ED     Status: None   Collection Time: 11/05/17  5:32 PM  Result Value Ref Range   I-stat hCG, quantitative <5.0 <5 mIU/mL   Comment 3            Comment:   GEST. AGE      CONC.  (mIU/mL)   <=1 WEEK        5 - 50     2 WEEKS       50 - 500     3 WEEKS       100 - 10,000     4 WEEKS     1,000 - 30,000        FEMALE AND NON-PREGNANT FEMALE:     LESS THAN 5 mIU/mL   Hepatic function panel     Status: Abnormal   Collection Time: 11/05/17 10:30 PM  Result Value Ref Range   Total Protein 6.9 6.5 - 8.1 g/dL   Albumin 3.9 3.5 - 5.0 g/dL   AST 275 (H) 15 - 41 U/L   ALT 153 (H) 14 - 54 U/L   Alkaline Phosphatase 77 38 - 126 U/L   Total Bilirubin  2.5 (H) 0.3 - 1.2 mg/dL   Bilirubin, Direct 1.3 (H) 0.1 - 0.5 mg/dL   Indirect Bilirubin 1.2 (H) 0.3 - 0.9 mg/dL    Comment: Performed at [facility], [place] [address], [place], [place] [address]  Lipase, blood     Status: None   Collection Time: 11/05/17 10:30 PM  Result Value Ref Range   Lipase 33 11 - 51 U/L    Comment: Performed at [facility], [place] [address] [place], [place] [address]  I-stat troponin, ED     Status: None   Collection Time: 11/06/17 12:07 AM  Result Value Ref Range   Troponin i, poc 0.00 0.00 - 0.08 ng/mL   Comment 3            Comment: Due to the release kinetics of cTnI, a negative result within the first hours of the onset of symptoms does not rule out myocardial infarction with certainty. If myocardial infarction is still suspected, repeat the test at appropriate intervals.   Comprehensive metabolic panel     Status: Abnormal    Collection Time: 11/06/17  6:11 AM  Result Value Ref Range   Sodium 137 135 - 145 mmol/L   Potassium 3.7 3.5 - 5.1 mmol/L   Chloride 109 101 - 111 mmol/L   CO2 21 (L) 22 - 32 mmol/L   Glucose, Bld 102 (H) 65 - 99 mg/dL   BUN 9 6 - 20 mg/dL   Creatinine, Ser 0.54 0.44 - 1.00 mg/dL   Calcium 8.0 (L) 8.9 - 10.3 mg/dL   Total Protein 5.3 (L) 6.5 - 8.1 g/dL   Albumin 3.0 (L) 3.5 - 5.0 g/dL   AST 266 (H) 15 - 41 U/L   ALT 205 (H) 14 - 54 U/L   Alkaline Phosphatase 72 38 - 126 U/L   Total Bilirubin 3.1 (H) 0.3 - 1.2 mg/dL   GFR calc non Af Amer >60 >60 mL/min   GFR calc Af Amer >60 >60 mL/min    Comment: (NOTE) The eGFR has been calculated using the CKD EPI equation. This calculation has not been validated in all clinical situations. eGFR's persistently <60 mL/min signify possible Chronic Kidney Disease.    Anion gap 7 5 - 15    Comment: Performed at [facility], [place] [address], [place], [place] [address]  HIV antibody (Routine Testing)     Status: None   Collection Time: 11/06/17  6:11 AM  Result Value Ref Range   HIV Screen 4th Generation wRfx Non Reactive Non Reactive    Comment: (NOTE) Performed At: [facility] [place], [place] [id number] Rush Farmer MD [id number] Performed at [facility], [place] [address], [place], [place] [address]   CBC     Status: None   Collection Time: 11/06/17  6:11 AM  Result Value Ref Range   WBC 7.0 4.0 - 10.5 K/uL   RBC 3.89 3.87 - 5.11 MIL/uL   Hemoglobin 12.2 12.0 - 15.0 g/dL   HCT 37.4 36.0 - 46.0 %   MCV 96.1 78.0 - 100.0 fL   MCH 31.4 26.0 - 34.0 pg   MCHC 32.6 30.0 - 36.0 g/dL   RDW 12.7 11.5 - 15.5 %   Platelets 216 150 - 400 K/uL    Comment: Performed at [facility], [place] [address], [place], [place] [address]  Culture, Urine     Status: None   Collection Time: 11/06/17  9:44  AM  Result Value Ref Range   Specimen Description      URINE, CLEAN  CATCH Performed at [facility], [place] [address], [place], [place] [address]    Special Requests      NONE Performed at [facility], [place] [address], [place], [place] [address]    Culture      NO GROWTH Performed at [facility], [place] [address] [place], [place] [address]    Report Status 11/07/2017 FINAL   Urinalysis, Routine w reflex microscopic     Status: Abnormal   Collection Time: 11/06/17  9:44 AM  Result Value Ref Range   Color, Urine AMBER (A) YELLOW    Comment: BIOCHEMICALS MAY BE AFFECTED BY COLOR   APPearance CLEAR CLEAR   Specific Gravity, Urine >1.046 (H) 1.005 - 1.030   pH 5.0 5.0 - 8.0   Glucose, UA NEGATIVE NEGATIVE mg/dL   Hgb urine dipstick NEGATIVE NEGATIVE   Bilirubin Urine SMALL (A) NEGATIVE   Ketones, ur 5 (A) NEGATIVE mg/dL   Protein, ur NEGATIVE NEGATIVE mg/dL   Nitrite NEGATIVE NEGATIVE   Leukocytes, UA NEGATIVE NEGATIVE    Comment: Performed at [facility], [place] [address] [place], [place] [address]  Comprehensive metabolic panel     Status: Abnormal   Collection Time: 11/07/17  4:06 AM  Result Value Ref Range   Sodium 140 135 - 145 mmol/L   Potassium 3.7 3.5 - 5.1 mmol/L   Chloride 111 101 - 111 mmol/L   CO2 23 22 - 32 mmol/L   Glucose, Bld 96 65 - 99 mg/dL   BUN 6 6 - 20 mg/dL   Creatinine, Ser 0.79 0.44 - 1.00 mg/dL   Calcium 8.2 (L) 8.9 - 10.3 mg/dL   Total Protein 5.5 (L) 6.5 - 8.1 g/dL   Albumin 3.2 (L) 3.5 - 5.0 g/dL   AST 80 (H) 15 - 41 U/L   ALT 155 (H) 14 - 54 U/L   Alkaline Phosphatase 81 38 - 126 U/L   Total Bilirubin 0.9 0.3 - 1.2 mg/dL   GFR calc non Af Amer >60 >60 mL/min   GFR calc Af Amer >60 >60 mL/min    Comment: (NOTE) The eGFR has been calculated using the CKD EPI equation. This calculation has not been validated in all clinical situations. eGFR's persistently <60 mL/min signify possible Chronic Kidney Disease.    Anion gap 6 5 - 15    Comment: Performed  at [facility], [place] [address] [place], [place] [address]  TSH     Status: None   Collection Time: 11/07/17 10:27 AM  Result Value Ref Range   TSH 3.220 0.350 - 4.500 uIU/mL    Comment: Performed by a 3rd Generation assay with a functional sensitivity of <=0.01 uIU/mL. Performed at [facility], [place] [address] [place], [place] [address]     Dg Chest 2 View  Result Date: 11/05/2017 CLINICAL DATA:  Middle chest pain EXAM: CHEST - 2 VIEW COMPARISON:  07/05/2011 FINDINGS: The heart size and mediastinal contours are within normal limits. Both lungs are clear. Minimal degenerative changes of the spine. IMPRESSION: No active cardiopulmonary disease. Electronically Signed   By: Donavan Foil M.D.   On: 11/05/2017 18:10   Ct Abdomen Pelvis W Contrast  Result Date: 11/06/2017 CLINICAL DATA:  Upper epigastric pain elevated Sheldon Sem count EXAM: CT ABDOMEN AND PELVIS WITH CONTRAST TECHNIQUE: Multidetector CT imaging of the abdomen and  pelvis was performed using the standard protocol following bolus administration of intravenous contrast. CONTRAST:  121m ISOVUE-300 IOPAMIDOL (ISOVUE-300) INJECTION 61% COMPARISON:  CT 11/16/2013 FINDINGS: Lower chest: Lung bases demonstrate no acute consolidation or pleural effusion. Heart size within normal limits. Small esophageal hiatal hernia. Hepatobiliary: Focal fat infiltration near the falciform ligament. Small cyst near the gallbladder fossa. Appearance of mild periportal edema. Gallbladder wall thickening versus small amount of pericholecystic fluid. No calcified gallstones. Extrahepatic bile duct upper normal. Pancreas: No inflammatory changes. Similar appearance of proximal pancreatic duct dilatation. Soft tissue fullness at the uncinate process and head of pancreas. Spleen: Normal in size without focal abnormality. Adrenals/Urinary Tract: Adrenal glands are unremarkable. Kidneys are normal, without renal calculi, focal lesion, or  hydronephrosis. Bladder is unremarkable. Stomach/Bowel: Stomach is within normal limits. Appendix appears normal. No evidence of bowel wall thickening, distention, or inflammatory changes. 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 No enlarged abdominal or pelvic lymph nodes. Reproductive: Uterus and bilateral adnexa are unremarkable. Other: Negative for free air or free fluid. Musculoskeletal: No acute or significant osseous findings. IMPRESSION: 1. Gallbladder wall thickening versus small amount of pericholecystic fluid. Suggest correlation with right upper quadrant abdominal ultrasound. 2. Similar appearance of prominent pancreatic duct and upper normal extrahepatic common bile duct. Soft tissue fullness at the uncinate process of the pancreas, probably due to adjacent nondistended duodenum. 3. Sigmoid colon diverticular disease without acute inflammation Electronically Signed   By: KDonavan FoilM.D.   On: 11/06/2017 00:36   Mr 3d Recon At Scanner  Result Date: 11/06/2017 CLINICAL DATA:  Abdominal pain and nausea. Elevated liver function tests. Cholelithiasis. Mild biliary and pancreatic ductal dilatation on recent ultrasound and CT. EXAM: MRI ABDOMEN WITHOUT AND WITH CONTRAST (INCLUDING MRCP) TECHNIQUE: Multiplanar multisequence MR imaging of the abdomen was performed both before and after the administration of intravenous contrast. Heavily T2-weighted images of the biliary and pancreatic ducts were obtained, and three-dimensional MRCP images were rendered by post processing. CONTRAST:  115mMULTIHANCE GADOBENATE DIMEGLUMINE 529 MG/ML IV SOLN COMPARISON:  Ultrasound on 11/06/2017 and CT on 11/05/2017 FINDINGS: Lower chest: No acute findings. Hepatobiliary: No hepatic masses identified. Tiny approximately 1 cm cyst is seen in the left lobe adjacent to the gallbladder fossa. Tiny gallstones are noted, without significant  gallbladder wall thickening or pericholecystic inflammatory changes. Mild biliary ductal dilatation seen with common bile duct measuring 7 mm. Two tiny less than 5 calculi are seen in the distal common bile duct. Pancreas: No mass or inflammatory changes. Mild pancreatic ductal dilatation is seen in the pancreatic head measuring up to 4 mm. No evidence of pancreas divisum. Spleen:  Within normal limits in size and appearance. Adrenals/Urinary Tract: No masses identified. No evidence of hydronephrosis. Stomach/Bowel: Visualized portion unremarkable. Vascular/Lymphatic: No pathologically enlarged lymph nodes identified. No abdominal aortic aneurysm. Other:  None. Musculoskeletal:  No suspicious bone lesions identified. IMPRESSION: Cholelithiasis, without definite evidence of acute cholecystitis. Mild diffuse biliary ductal dilatation due to 2 tiny less than 5 mm calculi in the distal common bile duct. Mild pancreatic ductal dilatation, without evidence of pancreas divisum or pancreatic mass. Electronically Signed   By: JoEarle Gell.D.   On: 11/06/2017 13:48   Mr Abdomen Mrcp W Moise Boringontast  Result Date: 11/06/2017 CLINICAL DATA:  Abdominal pain and nausea. Elevated liver function tests. Cholelithiasis. Mild biliary and pancreatic ductal dilatation on recent ultrasound and CT. EXAM: MRI ABDOMEN WITHOUT AND WITH CONTRAST (INCLUDING MRCP) TECHNIQUE:  Multiplanar multisequence MR imaging of the abdomen was performed both before and after the administration of intravenous contrast. Heavily T2-weighted images of the biliary and pancreatic ducts were obtained, and three-dimensional MRCP images were rendered by post processing. CONTRAST:  48m MULTIHANCE GADOBENATE DIMEGLUMINE 529 MG/ML IV SOLN COMPARISON:  Ultrasound on 11/06/2017 and CT on 11/05/2017 FINDINGS: Lower chest: No acute findings. Hepatobiliary: No hepatic masses identified. Tiny approximately 1 cm cyst is seen in the left lobe adjacent to the gallbladder fossa.  Tiny gallstones are noted, without significant gallbladder wall thickening or pericholecystic inflammatory changes. Mild biliary ductal dilatation seen with common bile duct measuring 7 mm. Two tiny less than 5 calculi are seen in the distal common bile duct. Pancreas: No mass or inflammatory changes. Mild pancreatic ductal dilatation is seen in the pancreatic head measuring up to 4 mm. No evidence of pancreas divisum. Spleen:  Within normal limits in size and appearance. Adrenals/Urinary Tract: No masses identified. No evidence of hydronephrosis. Stomach/Bowel: Visualized portion unremarkable. Vascular/Lymphatic: No pathologically enlarged lymph nodes identified. No abdominal aortic aneurysm. Other:  None. Musculoskeletal:  No suspicious bone lesions identified. IMPRESSION: Cholelithiasis, without definite evidence of acute cholecystitis. Mild diffuse biliary ductal dilatation due to 2 tiny less than 5 mm calculi in the distal common bile duct. Mild pancreatic ductal dilatation, without evidence of pancreas divisum or pancreatic mass. Electronically Signed   By: JEarle GellM.D.   On: 11/06/2017 13:48   UKoreaAbdomen Limited Ruq  Result Date: 11/06/2017 CLINICAL DATA:  Abnormal CT with right upper quadrant pain EXAM: ULTRASOUND ABDOMEN LIMITED RIGHT UPPER QUADRANT COMPARISON:  CT 11/05/2017 FINDINGS: Gallbladder: Multiple small stones measuring up to 4.5 mm. Increased wall thickness at 4 mm. Negative sonographic Murphy. Common bile duct: Diameter: Borderline to slightly enlarged measuring up to 7.5 mm. Liver: Small cyst adjacent to the gallbladder measuring 1.2 cm. Liver otherwise normal. Portal vein is patent on color Doppler imaging with normal direction of blood flow towards the liver. IMPRESSION: 1. Gallstones with wall thickening but negative sonographic MPercell Miller findings are equivocal for cholecystitis. If this remains a clinical concern, further evaluation with nuclear medicine hepatobiliary imaging could be  obtained to further evaluate 2. Borderline to slightly enlarged common bile duct up to 7.5 mm. Recommend correlation with LFTs with follow-up MRCP as indicated. Electronically Signed   By: KDonavan FoilM.D.   On: 11/06/2017 01:35    A/P: Courtney ECKSTEINis an 47y.o. female with choledocholithiasis -GI taking for ERCP today -I have recommended laparoscopic vs open cholecystectomy this admission following recovery from ERCP. Additionally, we will likely plan primary repair of small umbilical hernia at that time as well. -The anatomy and physiology of the hepatobiliary system was discussed at length with the patient with associated pictures. The pathophysiology of gallbladder disease was discussed at length with associated pictures. -The options for treatment were discussed including ongoing observation which may result in subsequent gallbladder complications (infection, pancreatitis, recurrent choledocholithiasis, etc). -The planned procedure, material risks (including, but not limited to, pain, bleeding, infection, scarring, need for blood transfusion, damage to surrounding structures- blood vessels/nerves/viscus/organs, damage to bile duct, bile leak, need for additional procedures, hernia, pancreatitis, pneumonia, heart attack, stroke, death) benefits and alternatives to surgery were discussed at length. I noted a good probability that the procedure would help improve their symptoms. The patient's questions were answered to their satisfaction, they voiced understanding and they elected to proceed with surgery. Additionally, we discussed typical postoperative expectations and the recovery process.  Sharon Mt. Dema Severin, M.D. [facility]

## 2017-11-07 NOTE — Anesthesia Preprocedure Evaluation (Addendum)
Anesthesia Evaluation  Patient identified by MRN, date of birth, ID band Patient awake    Reviewed: Allergy & Precautions, NPO status , Patient's Chart, lab work & pertinent test results  History of Anesthesia Complications (+) PONV and history of anesthetic complications  Airway Mallampati: II  TM Distance: >3 FB Neck ROM: Full    Dental no notable dental hx.    Pulmonary former smoker,    Pulmonary exam normal breath sounds clear to auscultation       Cardiovascular negative cardio ROS Normal cardiovascular exam Rhythm:Regular Rate:Normal  ECG: NSR, rate 76   Neuro/Psych  Headaches, negative psych ROS   GI/Hepatic Neg liver ROS,   Endo/Other  negative endocrine ROS  Renal/GU negative Renal ROS     Musculoskeletal Ehlers-Danlos disease   Abdominal (+) + obese,   Peds  Hematology negative hematology ROS (+)   Anesthesia Other Findings cbd stones  Reproductive/Obstetrics S/p BTL                            Anesthesia Physical Anesthesia Plan  ASA: II  Anesthesia Plan: General   Post-op Pain Management:    Induction: Intravenous  PONV Risk Score and Plan: 4 or greater and Ondansetron, Dexamethasone, Midazolam and Treatment may vary due to age or medical condition  Airway Management Planned: Oral ETT  Additional Equipment:   Intra-op Plan:   Post-operative Plan: Extubation in OR  Informed Consent: I have reviewed the patients History and Physical, chart, labs and discussed the procedure including the risks, benefits and alternatives for the proposed anesthesia with the patient or authorized representative who has indicated his/her understanding and acceptance.   Dental advisory given  Plan Discussed with: CRNA  Anesthesia Plan Comments:        Anesthesia Quick Evaluation

## 2017-11-07 NOTE — Progress Notes (Signed)
PROGRESS NOTE    LANEY LOUDERBACK  AVW:[id number] DOB: 03/20/1971 DOA: 11/05/2017 PCP: No primary care provider on file.   Brief Narrative:  HPI On 11/06/2017 by Dr. Lyda Perone ALEIGH GRUNDEN is a 47 y.o. female with medical history significant of GERD.  Patient presents to the ED with c/o upper abd pain that radiated to back and chest.  Occurred around 1AM yesterday, lasted 20 min, resolved.  Second episode 1pm yesterday, 20 min resolved.  Then reocurred 4pm yesterday and has been constant since that time. Similar episode about 4 months ago, with 2 short episodes of pain, resolved and hasnt reoccurred since then. PSHx of umbilical hernia repair. Has family history of cholecystectomy in her daughter.   Assessment & Plan   Abdominal pain/RUQ, possible secondary to cholelithiasis/choledocholithiasis -CT abdomen pelvis showed gallbladder wall thickening versus small amount of pericholecystic fluid. Similar appearance of prominent pancreatic duct and upper normal extrahepatic common bile duct. Soft tissue fullness at the uncinate process of the pancreas, probably due to adjacent nondistended duodenum. Sigmoid colon diverticular disease without acute inflammation -Right upper quadrant ultrasound showed gallstones with wall thickening.  Borderline to slightly enlarged common bile duct up to 7.5 mm. -Discussed with gastroenterology, Dr. Evette Cristal, recommended MRCP (noninvasive testing first) -MRCP showed cholelithiasis without definite evidence of acute cholecystitis.  Mild diffuse biliary ductal dilatation due to 2 tiny less than 5 mm calculi in the distal common bile duct.  Mild pancreatic ductal dilatation, without evidence of pancreatic divisum or pancreatic mass. -Continue empiric antibiotics with ciprofloxacin and Flagyl, IVF, pain control -Gastroenterology consulted and appreciated, plan for ERCP today -General surgery consulted and appreciated for possible lap chole  Elevated LFTs -Likely  secondary to the above, LFTS trending downward/improving -Continue IV fluids, monitor CMP  Bradycardia -Will check TSH and place on telemetry -currently not any medications that would affect her HR -asymptomatic   Allergic reaction -occurred after MRCP/contrast -given benadryl and resolved   DVT Prophylaxis  SCDs  Code Status: Full  Family Communication: None at bedside  Disposition Plan: Admitted, pending ERCP and possible lap chole  Consultants Gastroenterology, Dr. Evette Cristal General surgery  Procedures  RUQ [place] MRCP  Antibiotics   Anti-infectives (From admission, onward)   Start     Dose/Rate Route Frequency Ordered Stop   11/06/17 1200  ciprofloxacin (CIPRO) IVPB 400 mg     400 mg 200 mL/hr over 60 Minutes Intravenous Every 12 hours 11/06/17 0447     11/06/17 0400  metroNIDAZOLE (FLAGYL) IVPB 500 mg     500 mg 100 mL/hr over 60 Minutes Intravenous Every 8 hours 11/06/17 0359     11/06/17 0045  ciprofloxacin (CIPRO) IVPB 400 mg     400 mg 200 mL/hr over 60 Minutes Intravenous  Once 11/06/17 0040 11/06/17 0250      Subjective:   Stephania Fragmin seen and examined today.  Feels mild abdominal pain which radiates to her left shoulder blade.  Denies current chest pain, shortness of breath, nausea or vomiting, diarrhea or constipation.  States she had allergic reaction after receiving contrast to her MRI yesterday.  Denies any problems swallowing.  Objective:   Vitals:   11/06/17 1318 11/06/17 2026 11/07/17 0046 11/07/17 0609  BP: 127/83 126/81  115/68  Pulse: (!) 51 (!) 48 (!) 58 (!) 48  Resp: Temp: 98.3 F (36.8 C) 98.1 F (36.7 C)  97.9 F (36.6 C)  TempSrc: Oral Oral  Oral  SpO2: 99% 99%  100%  Weight:      Height:        Intake/Output Summary (Last 24 hours) at 11/07/2017 1141 Last data filed at 11/07/2017 1113 Gross per 24 hour  Intake 977.08 ml  Output 2275 ml  Net -1297.92 ml   Filed Weights   11/05/17 1710  Weight: 89.4 kg (197 lb)    Exam  General: Well developed, well nourished, NAD, appears stated age  HEENT: NCAT, mucous membranes moist.   Neck: Supple  Cardiovascular: S1 S2 auscultated, no murmurs, bradycardic   Respiratory: Clear to auscultation bilaterally with equal chest rise  Abdomen: Soft, mildly TTP RUQ, nondistended, +bowel sounds  Extremities: warm dry without cyanosis clubbing or edema  Neuro: AAOx3, nonfocal  Psych: appropriate mood and affect, pleasant  Data Reviewed: I have personally reviewed following labs and imaging studies  CBC: Recent Labs  Lab 11/05/17 1727 11/06/17 0611  WBC 13.4* 7.0  HGB 14.2 12.2  HCT 42.5 37.4  MCV 96.2 96.1  PLT 288 216   Basic Metabolic Panel: Recent Labs  Lab 11/05/17 1727 11/06/17 0611 11/07/17 0406  NA 141 137 140  K 3.7 3.7 3.7  CL 108 109 111  CO2 25 21* 23  GLUCOSE 91 102* 96  BUN CREATININE 0.92 0.54 0.79  CALCIUM 9.1 8.0* 8.2*   GFR: Estimated Creatinine Clearance: 97.1 mL/min (by C-G formula based on SCr of 0.79 mg/dL). Liver Function Tests: Recent Labs  Lab 11/05/17 2230 11/06/17 0611 11/07/17 0406  AST 275* 266* 80*  ALT 153* 205* 155*  ALKPHOS 77 72 81  BILITOT 2.5* 3.1* 0.9  PROT 6.9 5.3* 5.5*  ALBUMIN 3.9 3.0* 3.2*   Recent Labs  Lab 11/05/17 2230  LIPASE 33   No results for input(s): AMMONIA in the last 168 hours. Coagulation Profile: No results for input(s): INR, PROTIME in the last 168 hours. Cardiac Enzymes: No results for input(s): CKTOTAL, CKMB, CKMBINDEX, TROPONINI in the last 168 hours. BNP (last 3 results) No results for input(s): PROBNP in the last 8760 hours. HbA1C: No results for input(s): HGBA1C in the last 72 hours. CBG: No results for input(s): GLUCAP in the last 168 hours. Lipid Profile: No results for input(s): CHOL, HDL, LDLCALC, TRIG, CHOLHDL, LDLDIRECT in the last 72 hours. Thyroid Function Tests: Recent Labs    11/07/17 1027  TSH 3.220   Anemia Panel: No results for  input(s): VITAMINB12, FOLATE, FERRITIN, TIBC, IRON, RETICCTPCT in the last 72 hours. Urine analysis:    Component Value Date/Time   COLORURINE AMBER (A) 11/06/2017 0944   APPEARANCEUR CLEAR 11/06/2017 0944   LABSPEC >1.046 (H) 11/06/2017 0944   PHURINE 5.0 11/06/2017 0944   GLUCOSEU NEGATIVE 11/06/2017 0944   HGBUR NEGATIVE 11/06/2017 0944   BILIRUBINUR SMALL (A) 11/06/2017 0944   KETONESUR 5 (A) 11/06/2017 0944   PROTEINUR NEGATIVE 11/06/2017 0944   UROBILINOGEN 0.2 03/12/2014 1122   NITRITE NEGATIVE 11/06/2017 0944   LEUKOCYTESUR NEGATIVE 11/06/2017 0944   Sepsis Labs: (procalcitonin:4,lacticidven:4)  ) Recent Results (from the past 240 hour(s))  Culture, Urine     Status: None   Collection Time: 11/06/17  9:44 AM  Result Value Ref Range Status   Specimen Description   Final    URINE, CLEAN CATCH Performed at [facility], [address], [place], [place] [address]    Special Requests   Final    NONE Performed at [facility], [address] [place], [place] [address]  Culture   Final    NO GROWTH Performed at [facility], [address], [place], [place] [address]    Report Status 11/07/2017 FINAL  Final      Radiology Studies: Dg Chest 2 View  Result Date: 11/05/2017 CLINICAL DATA:  Middle chest pain EXAM: CHEST - 2 VIEW COMPARISON:  07/05/2011 FINDINGS: The heart size and mediastinal contours are within normal limits. Both lungs are clear. Minimal degenerative changes of the spine. IMPRESSION: No active cardiopulmonary disease. Electronically Signed   By: Jasmine Pang M.D.   On: 11/05/2017 18:10   Ct Abdomen Pelvis W Contrast  Result Date: 11/06/2017 CLINICAL DATA:  Upper epigastric pain elevated white count EXAM: CT ABDOMEN AND PELVIS WITH CONTRAST TECHNIQUE: Multidetector CT imaging of the abdomen and pelvis was performed using the standard protocol following bolus administration of intravenous contrast.  CONTRAST:  ISOVUE-300 IOPAMIDOL (ISOVUE-300) INJECTION 61% COMPARISON:  CT 11/16/2013 FINDINGS: Lower chest: Lung bases demonstrate no acute consolidation or pleural effusion. Heart size within normal limits. Small esophageal hiatal hernia. Hepatobiliary: Focal fat infiltration near the falciform ligament. Small cyst near the gallbladder fossa. Appearance of mild periportal edema. Gallbladder wall thickening versus small amount of pericholecystic fluid. No calcified gallstones. Extrahepatic bile duct upper normal. Pancreas: No inflammatory changes. Similar appearance of proximal pancreatic duct dilatation. Soft tissue fullness at the uncinate process and head of pancreas. Spleen: Normal in size without focal abnormality. Adrenals/Urinary Tract: Adrenal glands are unremarkable. Kidneys are normal, without renal calculi, focal lesion, or hydronephrosis. Bladder is unremarkable. Stomach/Bowel: Stomach is within normal limits. Appendix appears normal. No evidence of bowel wall thickening, distention, or inflammatory changes. 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 No enlarged abdominal or pelvic lymph nodes. Reproductive: Uterus and bilateral adnexa are unremarkable. Other: Negative for free air or free fluid. Musculoskeletal: No acute or significant osseous findings. IMPRESSION: 1. Gallbladder wall thickening versus small amount of pericholecystic fluid. Suggest correlation with right upper quadrant abdominal ultrasound. 2. Similar appearance of prominent pancreatic duct and upper normal extrahepatic common bile duct. Soft tissue fullness at the uncinate process of the pancreas, probably due to adjacent nondistended duodenum. 3. Sigmoid colon diverticular disease without acute inflammation Electronically Signed   By: Jasmine Pang M.D.   On: 11/06/2017 00:36   Mr 3d Recon At Scanner  Result Date: 11/06/2017 CLINICAL DATA:   Abdominal pain and nausea. Elevated liver function tests. Cholelithiasis. Mild biliary and pancreatic ductal dilatation on recent ultrasound and CT. EXAM: MRI ABDOMEN WITHOUT AND WITH CONTRAST (INCLUDING MRCP) TECHNIQUE: Multiplanar multisequence MR imaging of the abdomen was performed both before and after the administration of intravenous contrast. Heavily T2-weighted images of the biliary and pancreatic ducts were obtained, and three-dimensional MRCP images were rendered by post processing. CONTRAST:  18mL MULTIHANCE GADOBENATE DIMEGLUMINE 529 MG/ML IV SOLN COMPARISON:  Ultrasound on 11/06/2017 and CT on 11/05/2017 FINDINGS: Lower chest: No acute findings. Hepatobiliary: No hepatic masses identified. Tiny approximately 1 cm cyst is seen in the left lobe adjacent to the gallbladder fossa. Tiny gallstones are noted, without significant gallbladder wall thickening or pericholecystic inflammatory changes. Mild biliary ductal dilatation seen with common bile duct measuring 7 mm. Two tiny less than 5 calculi are seen in the distal common bile duct. Pancreas: No mass or inflammatory changes. Mild pancreatic ductal dilatation is seen in the pancreatic head measuring up to 4 mm. No evidence of pancreas divisum. Spleen:  Within normal limits in size  and appearance. Adrenals/Urinary Tract: No masses identified. No evidence of hydronephrosis. Stomach/Bowel: Visualized portion unremarkable. Vascular/Lymphatic: No pathologically enlarged lymph nodes identified. No abdominal aortic aneurysm. Other:  None. Musculoskeletal:  No suspicious bone lesions identified. IMPRESSION: Cholelithiasis, without definite evidence of acute cholecystitis. Mild diffuse biliary ductal dilatation due to 2 tiny less than 5 mm calculi in the distal common bile duct. Mild pancreatic ductal dilatation, without evidence of pancreas divisum or pancreatic mass. Electronically Signed   By: Myles Rosenthal M.D.   On: 11/06/2017 13:48   Mr Abdomen Mrcp Vivien Rossetti  Contast  Result Date: 11/06/2017 CLINICAL DATA:  Abdominal pain and nausea. Elevated liver function tests. Cholelithiasis. Mild biliary and pancreatic ductal dilatation on recent ultrasound and CT. EXAM: MRI ABDOMEN WITHOUT AND WITH CONTRAST (INCLUDING MRCP) TECHNIQUE: Multiplanar multisequence MR imaging of the abdomen was performed both before and after the administration of intravenous contrast. Heavily T2-weighted images of the biliary and pancreatic ducts were obtained, and three-dimensional MRCP images were rendered by post processing. CONTRAST:  18mL MULTIHANCE GADOBENATE DIMEGLUMINE 529 MG/ML IV SOLN COMPARISON:  Ultrasound on 11/06/2017 and CT on 11/05/2017 FINDINGS: Lower chest: No acute findings. Hepatobiliary: No hepatic masses identified. Tiny approximately 1 cm cyst is seen in the left lobe adjacent to the gallbladder fossa. Tiny gallstones are noted, without significant gallbladder wall thickening or pericholecystic inflammatory changes. Mild biliary ductal dilatation seen with common bile duct measuring 7 mm. Two tiny less than 5 calculi are seen in the distal common bile duct. Pancreas: No mass or inflammatory changes. Mild pancreatic ductal dilatation is seen in the pancreatic head measuring up to 4 mm. No evidence of pancreas divisum. Spleen:  Within normal limits in size and appearance. Adrenals/Urinary Tract: No masses identified. No evidence of hydronephrosis. Stomach/Bowel: Visualized portion unremarkable. Vascular/Lymphatic: No pathologically enlarged lymph nodes identified. No abdominal aortic aneurysm. Other:  None. Musculoskeletal:  No suspicious bone lesions identified. IMPRESSION: Cholelithiasis, without definite evidence of acute cholecystitis. Mild diffuse biliary ductal dilatation due to 2 tiny less than 5 mm calculi in the distal common bile duct. Mild pancreatic ductal dilatation, without evidence of pancreas divisum or pancreatic mass. Electronically Signed   By: Myles Rosenthal M.D.    On: 11/06/2017 13:48   US Abdomen Limited Ruq  Result Date: 11/06/2017 CLINICAL DATA:  Abnormal CT with right upper quadrant pain EXAM: ULTRASOUND ABDOMEN LIMITED RIGHT UPPER QUADRANT COMPARISON:  CT 11/05/2017 FINDINGS: Gallbladder: Multiple small stones measuring up to 4.5 mm. Increased wall thickness at 4 mm. Negative sonographic Murphy. Common bile duct: Diameter: Borderline to slightly enlarged measuring up to 7.5 mm. Liver: Small cyst adjacent to the gallbladder measuring 1.2 cm. Liver otherwise normal. Portal vein is patent on color Doppler imaging with normal direction of blood flow towards the liver. IMPRESSION: 1. Gallstones with wall thickening but negative sonographic Eulah Pont, findings are equivocal for cholecystitis. If this remains a clinical concern, further evaluation with nuclear medicine hepatobiliary imaging could be obtained to further evaluate 2. Borderline to slightly enlarged common bile duct up to 7.5 mm. Recommend correlation with LFTs with follow-up MRCP as indicated. Electronically Signed   By: Jasmine Pang M.D.   On: 11/06/2017 01:35     Scheduled Meds: . diphenhydrAMINE  50 mg Intravenous Once  . methylPREDNISolone (SOLU-MEDROL) injection  40 mg Intravenous Daily   Continuous Infusions: . sodium chloride 125 mL/hr at 11/07/17 0958  . sodium chloride    . ciprofloxacin 400 mg (11/07/17 1113)  . metronidazole Stopped (11/07/17 0457)  LOS: 1 day   Time Spent in minutes   45 minutes  Bay Jarquin D.O. on 11/07/2017 at 11:41 AM  Between 7am to 7pm - Pager - [phone]  After 7pm go to [email or web address] - password [email or web address]  And look for the night coverage person covering for me after hours  [facility] Office  [phone]

## 2017-11-07 NOTE — Op Note (Signed)
[facility] Patient Name: Courtney Clements Procedure Date: 11/07/2017 MRN: [id number] Attending MD: Vida Rigger , MD Date of Birth: 1970/11/28 CSN: [id number] Age: 47 Admit Type: Inpatient Procedure:                ERCP Indications:              Bile duct stone(s) on MRCP Providers:                Vida Rigger, MD, Roselie Awkward, RN, Priscella Mann                            RN, RN Referring MD:              Medicines:                General Anesthesia Complications:            No immediate complications. Estimated Blood Loss:     Estimated blood loss: none. Procedure:                Pre-Anesthesia Assessment:                           - Prior to the procedure, a History and Physical                            was performed, and patient medications and                            allergies were reviewed. The patient's tolerance of                            previous anesthesia was also reviewed. The risks                            and benefits of the procedure and the sedation                            options and risks were discussed with the patient.                            All questions were answered, and informed consent                            was obtained. Prior Anticoagulants: The patient has                            taken no previous anticoagulant or antiplatelet                            agents. ASA Grade Assessment: II - A patient with                            mild systemic disease. After reviewing the risks  and benefits, the patient was deemed in                            satisfactory condition to undergo the procedure.                           After obtaining informed consent, the scope was                            passed under direct vision. Throughout the                            procedure, the patient's blood pressure, pulse, and                            oxygen saturations were monitored continuously. The                     ZO-[id number] ([id number]) scope was introduced through                            the mouth, and used to inject contrast into and                            used to cannulate the bile duct. The ERCP was                            somewhat difficult due to challenging cannulation                            because of an unusual anatomic variant. The patient                            tolerated the procedure well. Scope In: Scope Out: Findings:      The major papilla was normal. On initial cannulation we initially       thought we had deep selective CBD cannulation but on injection it looked       more like the pancreas although it seemed to go along the spine instead       of crossing the spine and we elected to place One 4 Fr by 5 cm plastic       stent with a 3/4 external pigtail and no internal flaps was placed 4 cm       into the ventral pancreatic duct. Clear fluid flowed through the stent.       All the dye had drained by the end of the procedure The stent was in       good position. We then cannulated next to the stent in the customary       fashion although a few times the wire went along the pancreatic duct       however with repositioning we reviewed to get the wire to go up the CBD       and deep selective cannulation was obtained and a stone was seen on       initial cholangiogram and we proceeded with The biliary sphincterotomy  was extended with a Hydratome sphincterotome using ERBE electrocautery.       There was no post-sphincterotomy bleeding. We proceeded with the       sphincterotomy until we had adequate biliary drainage and could get the       fully bowed sphincterotome easily in and out of the duct and we       proceeded with multiple balloon pull-through's as below and The common       bile duct contained one stone, which was small in diameter and removed.       No additional stones were seen to pass and The biliary tree was swept       with a 12-15  mm adjustable balloon starting at the bifurcation and left       main hepatic duct. The 12 mm balloon passed readily through the patent       sphincterotomy source and we did sweep the duct a few times with the 15       mm balloon but lowered it to 12 to remove All stones were removed by       occlusion cholangiogram at the end of the procedure done in the       customary fashion. Nothing was found at the end of the procedure. There       was adequate biliary drainage the wire in the balloon were removed and       the scope was removed and the patient tolerated the procedure well Impression:               - The major papilla appeared normal.                           - Choledocholithiasis was found. Complete removal                            was accomplished by biliary sphincterotomy and                            balloon extraction.                           - One plastic stent was placed into the ventral                            pancreatic duct.                           - A biliary sphincterotomy was performed.                           - The biliary tree was swept and nothing was found                            at the end of the procedure. Moderate Sedation:      N/A- Per Anesthesia Care Recommendation:           - Continue present medications. Observe for signs                            of pancreatitis she will need  an x-ray in a week to                            confirm the stent has passed                           - Clear liquid diet for 6 hours.                           - Return to GI clinic PRN.                           - Telephone GI clinic if symptomatic PRN. Will need                            recheck liver tests as an outpatient to confirm                            back to normal as well                           - Refer to a surgeon tomorrow for laparoscopic                            cholecystectomy as long as no signs of pancreatitis                             and consider Intra-Op cholangiogram if readily                            available to confirm no more residual CBD stones. Procedure Code(s):        --- Professional ---                           [id number], Endoscopic retrograde                            cholangiopancreatography (ERCP); with placement of                            endoscopic stent into biliary or pancreatic duct,                            including pre- and post-dilation and guide wire                            passage, when performed, including sphincterotomy,                            when performed, each stent                           43264, Endoscopic retrograde  cholangiopancreatography (ERCP); with removal of                            calculi/debris from biliary/pancreatic duct(s)                           43262, 59, Endoscopic retrograde                            cholangiopancreatography (ERCP); with                            sphincterotomy/papillotomy Diagnosis Code(s):        --- Professional ---                           K80.50, Calculus of bile duct without cholangitis                            or cholecystitis without obstruction CPT copyright 2017 [employer]. All rights reserved. The codes documented in this report are preliminary and upon coder review may  be revised to meet current compliance requirements. Vida Rigger, MD 11/07/2017 2:43:06 PM This report has been signed electronically. Number of Addenda: 0

## 2017-11-07 NOTE — Transfer of Care (Signed)
Immediate Anesthesia Transfer of Care Note  Patient: Courtney Clements  Procedure(s) Performed: ENDOSCOPIC RETROGRADE CHOLANGIOPANCREATOGRAPHY (ERCP) (N/A )  Patient Location: PACU  Anesthesia Type:General  Level of Consciousness: drowsy, patient cooperative and responds to stimulation  Airway & Oxygen Therapy: Patient Spontanous Breathing and Patient connected to face mask oxygen  Post-op Assessment: Report given to RN and Post -op Vital signs reviewed and stable  Post vital signs: Reviewed and stable  Last Vitals:  Vitals Value Taken Time  BP    Temp    Pulse 82 11/07/2017  2:43 PM  Resp 15 11/07/2017  2:43 PM  SpO2 100 % 11/07/2017  2:43 PM  Vitals shown include unvalidated device data.  Last Pain:  Vitals:   11/07/17 1259  TempSrc: Oral  PainSc: 0-No pain      Patients Stated Pain Goal: 0 (11/07/17 1200)  Complications: No apparent anesthesia complications

## 2017-11-07 NOTE — Anesthesia Procedure Notes (Signed)
Procedure Name: Intubation Date/Time: 11/07/2017 1:42 PM Performed by: Thornell Mule, CRNA Pre-anesthesia Checklist: Patient identified, Emergency Drugs available, Suction available and Patient being monitored Patient Re-evaluated:Patient Re-evaluated prior to induction Oxygen Delivery Method: Circle system utilized Preoxygenation: Pre-oxygenation with 100% oxygen Induction Type: IV induction Ventilation: Mask ventilation without difficulty Laryngoscope Size: Miller and 3 Grade View: Grade I Tube type: Oral Tube size: 7.0 mm Number of attempts: 1 Airway Equipment and Method: Stylet and Oral airway Placement Confirmation: ETT inserted through vocal cords under direct vision,  positive ETCO2 and breath sounds checked- equal and bilateral Secured at: 21 cm Tube secured with: Tape Dental Injury: Teeth and Oropharynx as per pre-operative assessment

## 2017-11-07 NOTE — Progress Notes (Signed)
Hollis Fatima Blank 1:21 PM  Subjective: Patient seen and examined and hospital computer chart reviewed and case discussed with my partner Dr. Evette Cristal and she is doing better and wants to eat and has no new complaints  Objective: Vital signs stable afebrile no acute distress exam please see preassessment evaluation LFTs improved MRI reviewed  Assessment: cBD stones  Plan: ERCP including risks and success rate was rediscussed with the patient and will proceed this afternoon with anesthesia assistance and probable surgicalcholecystectomy tomorrow  [facility]  Pager [phone] After 5PM or if no answer call [phone]

## 2017-11-08 ENCOUNTER — Encounter (HOSPITAL_COMMUNITY): Payer: Self-pay | Admitting: Certified Registered Nurse Anesthetist

## 2017-11-08 ENCOUNTER — Encounter (HOSPITAL_COMMUNITY)
Admission: EM | Disposition: A | Discharge status: Home / Self Care | Payer: Self-pay | Source: Home / Self Care | Attending: Internal Medicine

## 2017-11-08 ENCOUNTER — Encounter (HOSPITAL_COMMUNITY): Payer: Self-pay | Admitting: Gastroenterology

## 2017-11-08 LAB — COMPREHENSIVE METABOLIC PANEL
ALBUMIN: 3.6 g/dL (ref 3.5–5.0)
ALT: 119 U/L — ABNORMAL HIGH (ref 14–54)
AST: 39 U/L (ref 15–41)
Alkaline Phosphatase: 87 U/L (ref 38–126)
Anion gap: 7 (ref 5–15)
BUN: 7 mg/dL (ref 6–20)
CHLORIDE: 108 mmol/L (ref 101–111)
CO2: 22 mmol/L (ref 22–32)
Calcium: 8.8 mg/dL — ABNORMAL LOW (ref 8.9–10.3)
Creatinine, Ser: 0.75 mg/dL (ref 0.44–1.00)
GFR calc Af Amer: >60 mL/min (ref >60)
GLUCOSE: 125 mg/dL — AB (ref 65–99)
POTASSIUM: 3.9 mmol/L (ref 3.5–5.1)
SODIUM: 137 mmol/L (ref 135–145)
Total Bilirubin: 0.5 mg/dL (ref 0.3–1.2)
Total Protein: 6.4 g/dL — ABNORMAL LOW (ref 6.5–8.1)

## 2017-11-08 LAB — CBC
HCT: 41.2 % (ref 36.0–46.0)
Hemoglobin: 13.3 g/dL (ref 12.0–15.0)
MCH: 31.4 pg (ref 26.0–34.0)
MCHC: 32.3 g/dL (ref 30.0–36.0)
MCV: 97.2 fL (ref 78.0–100.0)
PLATELETS: 275 10*3/uL (ref 150–400)
RBC: 4.24 MIL/uL (ref 3.87–5.11)
RDW: 12.5 % (ref 11.5–15.5)
WBC: 22 10*3/uL — AB (ref 4.0–10.5)

## 2017-11-08 LAB — LIPASE, BLOOD: LIPASE: 175 U/L — AB (ref 11–51)

## 2017-11-08 SURGERY — LAPAROSCOPIC CHOLECYSTECTOMY WITH INTRAOPERATIVE CHOLANGIOGRAM
Anesthesia: General | Site: Breast

## 2017-11-08 MED ORDER — MIDAZOLAM HCL 2 MG/2ML IJ SOLN
INTRAMUSCULAR | Status: AC
  Filled 2017-11-08: qty 2

## 2017-11-08 MED ORDER — LIDOCAINE 2% (20 MG/ML) 5 ML SYRINGE
INTRAMUSCULAR | Status: AC
  Filled 2017-11-08: qty 5

## 2017-11-08 MED ORDER — ROCURONIUM BROMIDE 10 MG/ML (PF) SYRINGE
PREFILLED_SYRINGE | INTRAVENOUS | Status: AC
  Filled 2017-11-08: qty 5

## 2017-11-08 MED ORDER — EPHEDRINE 5 MG/ML INJ
INTRAVENOUS | Status: AC
  Filled 2017-11-08: qty 10

## 2017-11-08 MED ORDER — FLUTICASONE PROPIONATE 50 MCG/ACT NA SUSP
1.0000 | Freq: Every day | NASAL | Status: DC
  Administered 2017-11-08 – 2017-11-12 (×5): 1 via NASAL
  Filled 2017-11-08: qty 16

## 2017-11-08 MED ORDER — HYDROMORPHONE HCL 1 MG/ML IJ SOLN
0.5000 mg | INTRAMUSCULAR | Status: DC | PRN
  Administered 2017-11-08: 1 mg via INTRAVENOUS
  Administered 2017-11-08: 0.5 mg via INTRAVENOUS
  Administered 2017-11-08 – 2017-11-10 (×10): 1 mg via INTRAVENOUS
  Filled 2017-11-08 (×13): qty 1

## 2017-11-08 MED ORDER — PROPOFOL 10 MG/ML IV BOLUS
INTRAVENOUS | Status: AC
  Filled 2017-11-08: qty 20

## 2017-11-08 MED ORDER — DEXAMETHASONE SODIUM PHOSPHATE 10 MG/ML IJ SOLN
INTRAMUSCULAR | Status: AC
  Filled 2017-11-08: qty 1

## 2017-11-08 MED ORDER — FENTANYL CITRATE (PF) 250 MCG/5ML IJ SOLN
INTRAMUSCULAR | Status: AC
  Filled 2017-11-08: qty 5

## 2017-11-08 MED ORDER — ONDANSETRON HCL 4 MG/2ML IJ SOLN
INTRAMUSCULAR | Status: AC
  Filled 2017-11-08: qty 2

## 2017-11-08 MED ORDER — HYDROMORPHONE HCL 1 MG/ML IJ SOLN: 0.5000 mg | INTRAMUSCULAR | Status: DC | PRN

## 2017-11-08 MED ORDER — PHENYLEPHRINE 40 MCG/ML (10ML) SYRINGE FOR IV PUSH (FOR BLOOD PRESSURE SUPPORT)
PREFILLED_SYRINGE | INTRAVENOUS | Status: AC
Start: 2017-11-08 — End: 2017-11-08
  Filled 2017-11-08: qty 10

## 2017-11-08 MED ORDER — SUCCINYLCHOLINE CHLORIDE 200 MG/10ML IV SOSY
PREFILLED_SYRINGE | INTRAVENOUS | Status: AC
Start: 2017-11-08 — End: 2017-11-08
  Filled 2017-11-08: qty 10

## 2017-11-08 NOTE — Progress Notes (Signed)
[facility] Progress Note  Courtney Clements 47 y.o. [birth year]  CC:   choledocholithiasis   Subjective: patient experienced severe epigastric pain which was radiating to back associated with nausea yesterday after the procedure. She was found to have elevated lipase and mild leukocytosis. Pain currently controlled with pain medication. No bowel movement today.  ROS : positive for chest discomfort with deep breathing. Negative for shortness of breath.   Objective: Vital signs in last 24 hours: Vitals:   11/07/17 1500 11/08/17 0415  BP: (!) 147/99 122/86  Pulse: 85 (!) 57  Resp: (!) 21 20  Temp:  98.6 F (37 C)  SpO2: 94% 100%    Physical Exam:  General:  Alert, cooperative, no distress, appears stated age  Head:  Normocephalic, without obvious abnormality, atraumatic  Eyes:  , EOM's intact,   Lungs:   Clear to auscultation bilaterally, respirations unlabored  Heart:  Regular rate and rhythm, S1, S2 normal  Abdomen:   Mild epigastric tenderness to palpation, soft, nontender, nondistended, bowel sounds present  Extremities: Extremities normal, atraumatic, no  edema       Lab Results: Recent Labs    11/07/17 0406 11/08/17 0627  NA 140 137  K 3.7 3.9  CL 111 108  CO2 23 22  GLUCOSE 96 125*  BUN 6 7  CREATININE 0.79 0.75  CALCIUM 8.2* 8.8*   Recent Labs    11/07/17 0406 11/08/17 0627  AST 80* 39  ALT 155* 119*  ALKPHOS 81 87  BILITOT 0.9 0.5  PROT 5.5* 6.4*  ALBUMIN 3.2* 3.6   Recent Labs    11/06/17 0611 11/08/17 0627  WBC 7.0 22.0*  HGB 12.2 13.3  HCT 37.4 41.2  MCV 96.1 97.2  PLT 216 275   No results for input(s): LABPROT, INR in the last 72 hours.    Assessment/Plan: - choledocholithiasis. Status post ERCP with sphincterotomy and stone removal yesterday. Patient had  pancreatic duct cannulation as well as injection. Status post pancreatic duct stent placement. - epigastric abdominal pain. Most likely post-ERCP  pancreatitis  Recommendations ----------------------- - Increase IV fluids to 2 50 mL/h. - keep nothing by mouth for today.Consider starting diet tomorrow pending plan for cholecystectomy - Cholecystectomy currently on hold - GI will follow   Kathi Der MD, FACP 11/08/2017, 1:40 PM  Contact #  [phone]

## 2017-11-08 NOTE — Progress Notes (Signed)
Subjective Underwent ERCP yesterday with clearing of stones from CBD. Noted pancreatic duct cannulation with stent placement. 2hrs following procedure she began having burning MEG discomfort that kept her awake last night. No n/v. Pain persists this morning but is stable.  Objective: Vital signs in last 24 hours: Temp:  [98.3 F (36.8 C)-99.2 F (37.3 C)] 98.6 F (37 C) (05/03 0415) Pulse Rate:  [50-85] 57 (05/03 0415) Resp:  [12-21] 20 (05/03 0415) BP: (122-172)/(84-99) 122/86 (05/03 0415) SpO2:  [94 %-100 %] 100 % (05/03 0415) Weight:  [89.4 kg (197 lb)] 89.4 kg (197 lb) (05/02 1259) Last BM Date: 11/07/17  Intake/Output from previous day: 05/02 0701 - 05/03 0700 In: 4997.1 [I.V.:4297.1; IV Piggyback:700] Out: 1400 [Urine:1400] Intake/Output this shift: No intake/output data recorded.  Gen: NAD, comfortable CV: RRR Pulm: Normal work of breathing Abd: Soft, moderately ttp in MEG, RUQ and LUQ; no LLQ nor RLQ tenderness. No rebound/guarding. Ext: SCDs in place  Lab Results: CBC  Recent Labs    11/06/17 0611 11/08/17 0627  WBC 7.0 22.0*  HGB 12.2 13.3  HCT 37.4 41.2  PLT 216 275   BMET Recent Labs    11/07/17 0406 11/08/17 0627  NA 140 137  K 3.7 3.9  CL 111 108  CO2 23 22  GLUCOSE 96 125*  BUN 6 7  CREATININE 0.79 0.75  CALCIUM 8.2* 8.8*   PT/INR No results for input(s): LABPROT, INR in the last 72 hours. ABG No results for input(s): PHART, HCO3 in the last 72 hours.  Invalid input(s): PCO2, PO2  Studies/Results:  Anti-infectives: Anti-infectives (From admission, onward)   Start     Dose/Rate Route Frequency Ordered Stop   11/06/17 1200  ciprofloxacin (CIPRO) IVPB 400 mg     400 mg 200 mL/hr over 60 Minutes Intravenous Every 12 hours 11/06/17 0447     11/06/17 0400  metroNIDAZOLE (FLAGYL) IVPB 500 mg     500 mg 100 mL/hr over 60 Minutes Intravenous Every 8 hours 11/06/17 0359     11/06/17 0045  ciprofloxacin (CIPRO) IVPB 400 mg     400 mg 200  mL/hr over 60 Minutes Intravenous  Once 11/06/17 0040 11/06/17 0250       Assessment/Plan: Patient Active Problem List   Diagnosis Date Noted  . Acute cholecystitis 11/06/2017  . Transaminitis 11/06/2017  . Calculus of gallbladder with acute cholecystitis without obstruction 11/06/2017  . Abnormal magnetic resonance cholangiopancreatography (MRCP)   . DUB (dysfunctional uterine bleeding) 03/26/2014   s/p Procedure(s): ENDOSCOPIC RETROGRADE CHOLANGIOPANCREATOGRAPHY (ERCP) 11/07/2017 Ms. Orser is a pleasant 47yoF with choledocholithiasis s/p ERCP 11/07/17 -Probable pancreatitis - lipase 175 and wbc bumped to 22; clinically has sxs consistent with this -Will await resolution of abdominal discomfort and down-trending/near normal lipase -Cholecystectomy anticipated this admission - we will follow with you   LOS: 2 days   Stephanie Coup. Cliffton Asters, M.D. [facility]

## 2017-11-08 NOTE — Progress Notes (Signed)
PHARMACY NOTE -  ciprofloxacin  Pharmacy has been assisting with dosing of ciprofloxacin for intra-abd infection. Dosage remains stable at 400 mg IV q12h and need for further dosage adjustment appears unlikely at present.    Will sign off at this time.  Please reconsult if a change in clinical status warrants re-evaluation of dosage.  Dorna Leitz, PharmD, BCPS 11/08/2017 7:10 AM

## 2017-11-08 NOTE — Progress Notes (Signed)
PROGRESS NOTE    Courtney Clements  WUJ:[id number] DOB: [birth year] DOA: 11/05/2017 PCP: No primary care provider on file.   Brief Narrative:  HPI On 11/06/2017 by Dr. Lyda Perone Courtney Clements is a 47 y.o. female with medical history significant of GERD.  Patient presents to the ED with c/o upper abd pain that radiated to back and chest.  Occurred around 1AM yesterday, lasted 20 min, resolved.  Second episode 1pm yesterday, 20 min resolved.  Then reocurred 4pm yesterday and has been constant since that time. Similar episode about 4 months ago, with 2 short episodes of pain, resolved and hasnt reoccurred since then. PSHx of umbilical hernia repair. Has family history of cholecystectomy in her daughter.  Interim history  Admitted and found to have gallstones and CBD stones. S/p ERCP. Pending lap chole.  Assessment & Plan   Abdominal pain/RUQ, possible secondary to cholelithiasis/choledocholithiasis -CT abdomen pelvis showed gallbladder wall thickening versus small amount of pericholecystic fluid. Similar appearance of prominent pancreatic duct and upper normal extrahepatic common bile duct. Soft tissue fullness at the uncinate process of the pancreas, probably due to adjacent nondistended duodenum. Sigmoid colon diverticular disease without acute inflammation -Right upper quadrant ultrasound showed gallstones with wall thickening.  Borderline to slightly enlarged common bile duct up to 7.5 mm. -Discussed with gastroenterology, Dr. Evette Cristal, recommended MRCP (noninvasive testing first) -MRCP showed cholelithiasis without definite evidence of acute cholecystitis.  Mild diffuse biliary ductal dilatation due to 2 tiny less than 5 mm calculi in the distal common bile duct.  Mild pancreatic ductal dilatation, without evidence of pancreatic divisum or pancreatic mass. -Continue empiric antibiotics with ciprofloxacin and Flagyl, IVF, pain control -Gastroenterology consulted and appreciated, s/p ERCP: Complete  removal accomplished by biliary sphincterotomy and balloon extraction.  One plastic stent placed in the ventral pancreatic duct.  Biliary sphincterotomy was performed.  Patient will need repeat x-ray in 1 week to confirm stent has passed.  Recommended Intra-Op cholangiogram to confirm no further CBD stones. -General surgery consulted and appreciated for possible lap chole -Given pain, will change Dilaudid  ERCP associated pancreatitis -Lipase 175 -Place patient on bowel rest, continue IV fluids, pain management, and monitor  Leukocytosis -Suspect secondary to the above, patient currently afebrile -Denies any urinary symptoms, shortness of breath or cough-doubt infection as patient currently on antibiotics as above -will continue to monitor CBC  Elevated LFTs -Likely secondary to the above, LFTS trending downward/improving -Continue IV fluids, monitor CMP  Bradycardia -Telemetry monitoring shows no further bradycardia -currently not any medications that would affect her HR -asymptomatic  -TSH 3.22  Allergic reaction -occurred after MRCP/contrast -given benadryl and resolved   DVT Prophylaxis  SCDs  Code Status: Full  Family Communication: None at bedside  Disposition Plan: Admitted, pending improvement in WBC/lipase, and lap chole. Suspect home when stable  Consultants Gastroenterology, Dr. Evette Cristal General surgery  Procedures  RUQ [place] MRCP ERCP  Antibiotics   Anti-infectives (From admission, onward)   Start     Dose/Rate Route Frequency Ordered Stop   11/06/17 1200  ciprofloxacin (CIPRO) IVPB 400 mg     400 mg 200 mL/hr over 60 Minutes Intravenous Every 12 hours 11/06/17 0447     11/06/17 0400  metroNIDAZOLE (FLAGYL) IVPB 500 mg     500 mg 100 mL/hr over 60 Minutes Intravenous Every 8 hours 11/06/17 0359     11/06/17 0045  ciprofloxacin (CIPRO) IVPB 400 mg     400 mg 200 mL/hr over 60 Minutes Intravenous  Once  11/06/17 0040 11/06/17 0250      Subjective:    Courtney Clements seen and examined today.  Complains of abdominal pain, rating it has a 6 out of 10.  Denies current chest pain, shortness of breath, nausea or vomiting, diarrhea or constipation, dizziness or headache.  Objective:   Vitals:   11/07/17 1444 11/07/17 1450 11/07/17 1500 11/08/17 0415  BP: (!) 172/85 (!) 158/84 (!) 147/99 122/86  Pulse: 82 60 85 (!) 57  Resp: 15 18 (!) 21 20  Temp: 98.3 F (36.8 C)   98.6 F (37 C)  TempSrc: Oral   Oral  SpO2: 100% 96% 94% 100%  Weight:      Height:        Intake/Output Summary (Last 24 hours) at 11/08/2017 1314 Last data filed at 11/07/2017 1801 Gross per 24 hour  Intake 1772.08 ml  Output 0 ml  Net 1772.08 ml   Filed Weights   11/05/17 1710 11/07/17 1259  Weight: 89.4 kg (197 lb) 89.4 kg (197 lb)   Exam  General: Well developed, well nourished, NAD, appears stated age  HEENT: NCAT, mucous membranes moist.   Neck: Supple  Cardiovascular: S1 S2 auscultated, no rubs, murmurs or gallops. Regular rate and rhythm.  Respiratory: Clear to auscultation bilaterally with equal chest rise  Abdomen: Soft, RUQ/LUQ TTP, nondistended, + bowel sounds  Extremities: warm dry without cyanosis clubbing or edema  Neuro: AAOx3, nonfocal  Psych: Normal affect and demeanor with intact judgement and insight  Data Reviewed: I have personally reviewed following labs and imaging studies  CBC: Recent Labs  Lab 11/05/17 1727 11/06/17 0611 11/08/17 0627  WBC 13.4* 7.0 22.0*  HGB 14.2 12.2 13.3  HCT 42.5 37.4 41.2  MCV 96.2 96.1 97.2  PLT 288 216 275   Basic Metabolic Panel: Recent Labs  Lab 11/05/17 1727 11/06/17 0611 11/07/17 0406 11/08/17 0627  NA 141 137 140 137  K 3.7 3.7 3.7 3.9  CL 108 109 111 108  CO2 25 21* 23 22  GLUCOSE 91 102* 96 125*  BUN CREATININE 0.92 0.54 0.79 0.75  CALCIUM 9.1 8.0* 8.2* 8.8*   GFR: Estimated Creatinine Clearance: 97.1 mL/min (by C-G formula based on SCr of 0.75 mg/dL). Liver  Function Tests: Recent Labs  Lab 11/05/17 2230 11/06/17 0611 11/07/17 0406 11/08/17 0627  AST 275* 266* 80* 39  ALT 153* 205* 155* 119*  ALKPHOS 77 72 81 87  BILITOT 2.5* 3.1* 0.9 0.5  PROT 6.9 5.3* 5.5* 6.4*  ALBUMIN 3.9 3.0* 3.2* 3.6   Recent Labs  Lab 11/05/17 2230 11/08/17 0627  LIPASE 33 175*   No results for input(s): AMMONIA in the last 168 hours. Coagulation Profile: No results for input(s): INR, PROTIME in the last 168 hours. Cardiac Enzymes: No results for input(s): CKTOTAL, CKMB, CKMBINDEX, TROPONINI in the last 168 hours. BNP (last 3 results) No results for input(s): PROBNP in the last 8760 hours. HbA1C: No results for input(s): HGBA1C in the last 72 hours. CBG: No results for input(s): GLUCAP in the last 168 hours. Lipid Profile: No results for input(s): CHOL, HDL, LDLCALC, TRIG, CHOLHDL, LDLDIRECT in the last 72 hours. Thyroid Function Tests: Recent Labs    11/07/17 1027  TSH 3.220   Anemia Panel: No results for input(s): VITAMINB12, FOLATE, FERRITIN, TIBC, IRON, RETICCTPCT in the last 72 hours. Urine analysis:    Component Value Date/Time   COLORURINE AMBER (A) 11/06/2017 0944   APPEARANCEUR CLEAR 11/06/2017 0944  LABSPEC >1.046 (H) 11/06/2017 0944   PHURINE 5.0 11/06/2017 0944   GLUCOSEU NEGATIVE 11/06/2017 0944   HGBUR NEGATIVE 11/06/2017 0944   BILIRUBINUR SMALL (A) 11/06/2017 0944   KETONESUR 5 (A) 11/06/2017 0944   PROTEINUR NEGATIVE 11/06/2017 0944   UROBILINOGEN 0.2 03/12/2014 1122   NITRITE NEGATIVE 11/06/2017 0944   LEUKOCYTESUR NEGATIVE 11/06/2017 0944   Sepsis Labs: (procalcitonin:4,lacticidven:4)  ) Recent Results (from the past 240 hour(s))  Culture, Urine     Status: None   Collection Time: 11/06/17  9:44 AM  Result Value Ref Range Status   Specimen Description   Final    URINE, CLEAN CATCH Performed at [facility], [address] [place], [place] [address]    Special Requests   Final     NONE Performed at [facility], [address], [place], [place] [address]    Culture   Final    NO GROWTH Performed at [facility], [address] [place], [place] [address]    Report Status 11/07/2017 FINAL  Final  MRSA PCR Screening     Status: None   Collection Time: 11/07/17  4:28 PM  Result Value Ref Range Status   MRSA by PCR NEGATIVE NEGATIVE Final    Comment:        The GeneXpert MRSA Assay (FDA approved for NASAL specimens only), is one component of a comprehensive MRSA colonization surveillance program. It is not intended to diagnose MRSA infection nor to guide or monitor treatment for MRSA infections. Performed at [facility], [address] [place], [place] [address]       Radiology Studies: Dg Ercp Biliary & Pancreatic Ducts  Result Date: 11/07/2017 CLINICAL DATA:  47 year old female with choledocholithiasis EXAM: ERCP TECHNIQUE: Multiple spot images obtained with the fluoroscopic device and submitted for interpretation post-procedure. FLUOROSCOPY TIME:  Fluoroscopy Time: 8 minutes 46 seconds for a total of 95.7 mGy COMPARISON:  MRCP 11/06/2017 FINDINGS: A total of 7 intraoperative saved images were obtained during ERCP and submitted for review. The images demonstrate a flexible endoscope in the descending duodenum with wire cannulation of the left intrahepatic ducts. Cholangiogram demonstrates a small filling defect in the distal common bile duct. Subsequent images demonstrate sphincterotomy and balloon sweep of the common duct. IMPRESSION: ERCP with sphincterotomy and balloon sweep of the common duct. These images were submitted for radiologic interpretation only. Please see the procedural report for the amount of contrast and the fluoroscopy time utilized. Electronically Signed   By: Malachy Moan M.D.   On: 11/07/2017 14:55     Scheduled Meds: . Chlorhexidine Gluconate Cloth  6 each Topical Once  . heparin  5,000  Units Subcutaneous Once   Continuous Infusions: . sodium chloride 125 mL/hr at 11/08/17 0908  . ciprofloxacin 400 mg (11/07/17 2357)  . metronidazole 500 mg (11/08/17 1224)     LOS: 2 days   Time Spent in minutes   45 minutes  Chevis Weisensel D.O. on 11/08/2017 at 1:14 PM  Between 7am to 7pm - Pager - [phone]  After 7pm go to [email or web address] - password [email or web address]  And look for the night coverage person covering for me after hours  [facility] Office  [phone]

## 2017-11-08 NOTE — Care Management Note (Signed)
Case Management Note  Patient Details  Name: Courtney Clements MRN: [id number] Date of Birth: 03-02-1971  Subjective/Objective:                  Biliary obstruction  Action/Plan: Date: Nov 08, 2017 Courtney Clements, BSN, [name], [name] [phone] Chart and notes review for patient progress and needs. Will follow for case management and discharge needs.  Lipase remains elevated and wbc elevated No cm or discharge needs present at time of this review. Next review date: [id number]  Expected Discharge Date:  (unknown)               Expected Discharge Plan:  Home/Self Care  In-House Referral:     Discharge planning Services  CM Consult  Post Acute Care Choice:    Choice offered to:     DME Arranged:    DME Agency:     HH Arranged:    HH Agency:     Status of Service:  In process, will continue to follow  If discussed at Long Length of Stay Meetings, dates discussed:    Additional Comments:  Golda Acre, RN 11/08/2017, 9:45 AM

## 2017-11-08 NOTE — Progress Notes (Signed)
Patient requesting Flonase.  MD notified via text page.

## 2017-11-09 LAB — COMPREHENSIVE METABOLIC PANEL
ALK PHOS: 71 U/L (ref 38–126)
ALT: 75 U/L — ABNORMAL HIGH (ref 14–54)
ANION GAP: 8 (ref 5–15)
AST: 25 U/L (ref 15–41)
Albumin: 3 g/dL — ABNORMAL LOW (ref 3.5–5.0)
BUN: 5 mg/dL — ABNORMAL LOW (ref 6–20)
CO2: 22 mmol/L (ref 22–32)
Calcium: 7.9 mg/dL — ABNORMAL LOW (ref 8.9–10.3)
Chloride: 106 mmol/L (ref 101–111)
Creatinine, Ser: 0.74 mg/dL (ref 0.44–1.00)
GFR calc non Af Amer: >60 mL/min (ref >60)
Glucose, Bld: 113 mg/dL — ABNORMAL HIGH (ref 65–99)
Potassium: 3.6 mmol/L (ref 3.5–5.1)
SODIUM: 136 mmol/L (ref 135–145)
Total Bilirubin: 0.8 mg/dL (ref 0.3–1.2)
Total Protein: 5.3 g/dL — ABNORMAL LOW (ref 6.5–8.1)

## 2017-11-09 LAB — CBC
HCT: 36.5 % (ref 36.0–46.0)
HEMOGLOBIN: 11.6 g/dL — AB (ref 12.0–15.0)
MCH: 31.2 pg (ref 26.0–34.0)
MCHC: 31.8 g/dL (ref 30.0–36.0)
MCV: 98.1 fL (ref 78.0–100.0)
PLATELETS: 212 10*3/uL (ref 150–400)
RBC: 3.72 MIL/uL — ABNORMAL LOW (ref 3.87–5.11)
RDW: 13 % (ref 11.5–15.5)
WBC: 13.4 10*3/uL — ABNORMAL HIGH (ref 4.0–10.5)

## 2017-11-09 LAB — LIPASE, BLOOD: Lipase: 47 U/L (ref 11–51)

## 2017-11-09 MED ORDER — NAPHAZOLINE-GLYCERIN 0.012-0.2 % OP SOLN
1.0000 [drp] | Freq: Four times a day (QID) | OPHTHALMIC | Status: DC | PRN
  Administered 2017-11-09 – 2017-11-10 (×2): 2 [drp] via OPHTHALMIC
  Filled 2017-11-09: qty 15

## 2017-11-09 MED ORDER — FAMOTIDINE IN NACL 20-0.9 MG/50ML-% IV SOLN
20.0000 mg | Freq: Two times a day (BID) | INTRAVENOUS | Status: DC
  Administered 2017-11-09 – 2017-11-12 (×6): 20 mg via INTRAVENOUS
  Filled 2017-11-09 (×6): qty 50

## 2017-11-09 MED ORDER — LORATADINE 10 MG PO TABS
10.0000 mg | ORAL_TABLET | Freq: Every day | ORAL | Status: DC
  Administered 2017-11-09 – 2017-11-12 (×4): 10 mg via ORAL
  Filled 2017-11-09 (×5): qty 1

## 2017-11-09 MED ORDER — POLYETHYLENE GLYCOL 3350 17 G PO PACK
17.0000 g | PACK | Freq: Every day | ORAL | Status: DC
  Administered 2017-11-09 – 2017-11-12 (×5): 17 g via ORAL
  Filled 2017-11-09 (×4): qty 1

## 2017-11-09 MED ORDER — BUTALBITAL-APAP-CAFFEINE 50-325-40 MG PO TABS
1.0000 | ORAL_TABLET | Freq: Four times a day (QID) | ORAL | Status: DC | PRN
  Administered 2017-11-09 (×2): 1 via ORAL
  Administered 2017-11-12: 2 via ORAL
  Filled 2017-11-09: qty 2
  Filled 2017-11-09: qty 1
  Filled 2017-11-09: qty 2

## 2017-11-09 NOTE — Progress Notes (Signed)
Subjective Underwent ERCP Thur with clearing of stones from CBD. Noted pancreatic duct cannulation with stent placement. Now with post ERCP pancreatitis.  Still having quite a bit of pain and nausea this am. Objective: Vital signs in last 24 hours: Temp:  [98.7 F (37.1 C)-99.1 F (37.3 C)] 99 F (37.2 C) (05/04 0534) Pulse Rate:  [57-63] 57 (05/04 0534) Resp:  [20] 20 (05/04 0534) BP: (123-141)/(80-86) 138/80 (05/04 0534) SpO2:  [97 %-100 %] 97 % (05/04 0534) Last BM Date: 11/07/17  Intake/Output from previous day: 05/03 0701 - 05/04 0700 In: 4381.3 [I.V.:4181.3; IV Piggyback:200] Out: -  Intake/Output this shift: No intake/output data recorded.  Gen: NAD, uncomfortable Abd: Soft, moderately distended, ttp sub xyphoid; mild LLQ and RLQ tenderness as well. No rebound/guarding. Ext: SCDs in place  Lab Results: CBC  Recent Labs    11/08/17 0627 11/09/17 0556  WBC 22.0* 13.4*  HGB 13.3 11.6*  HCT 41.2 36.5  PLT 275 212   BMET Recent Labs    11/08/17 0627 11/09/17 0556  NA 137 136  K 3.9 3.6  CL 108 106  CO2 22 22  GLUCOSE 125* 113*  BUN 7 5*  CREATININE 0.75 0.74  CALCIUM 8.8* 7.9*   PT/INR No results for input(s): LABPROT, INR in the last 72 hours. ABG No results for input(s): PHART, HCO3 in the last 72 hours.  Invalid input(s): PCO2, PO2  Studies/Results:  Anti-infectives: Anti-infectives (From admission, onward)   Start     Dose/Rate Route Frequency Ordered Stop   11/06/17 1200  ciprofloxacin (CIPRO) IVPB 400 mg     400 mg 200 mL/hr over 60 Minutes Intravenous Every 12 hours 11/06/17 0447     11/06/17 0400  metroNIDAZOLE (FLAGYL) IVPB 500 mg     500 mg 100 mL/hr over 60 Minutes Intravenous Every 8 hours 11/06/17 0359     11/06/17 0045  ciprofloxacin (CIPRO) IVPB 400 mg     400 mg 200 mL/hr over 60 Minutes Intravenous  Once 11/06/17 0040 11/06/17 0250       Assessment/Plan: Patient Active Problem List   Diagnosis Date Noted  . Acute  cholecystitis 11/06/2017  . Transaminitis 11/06/2017  . Calculus of gallbladder with acute cholecystitis without obstruction 11/06/2017  . Abnormal magnetic resonance cholangiopancreatography (MRCP)   . DUB (dysfunctional uterine bleeding) 03/26/2014   s/p Procedure(s): ENDOSCOPIC RETROGRADE CHOLANGIOPANCREATOGRAPHY (ERCP) 11/07/2017 Courtney Clements is a pleasant 47yoF with choledocholithiasis s/p ERCP 11/07/17 -Probable post ERCP pancreatitis - lipase 175 and wbc bumped to 22; clinically has sxs consistent with this.  Labs have trended down  -Will await resolution of abdominal discomfort -Cholecystectomy anticipated this admission - we will follow with you -clears as tolerated today, NPO after MN  Vanita Panda, MD  Colorectal and General Surgery [facility]

## 2017-11-09 NOTE — Progress Notes (Signed)
[facility] Progress Note  Courtney Clements 47 y.o. 03-09-1971  CC:   choledocholithiasis   Subjective:  Continues to have abdominal pain and nausea but has improved compared to yesterday Denied any vomiting. No bowel movement in last 2 days.  ROS : negative for chest pain and  shortness of breath.   Objective: Vital signs in last 24 hours: Vitals:   11/08/17 2112 11/09/17 0534  BP: (!) 141/85 138/80  Pulse: 61 (!) 57  Resp: 20 20  Temp: 99.1 F (37.3 C) 99 F (37.2 C)  SpO2: 98% 97%    Physical Exam:  General:  Alert, cooperative, no distress, appears stated age  Head:  Normocephalic, without obvious abnormality, atraumatic  Eyes:  , EOM's intact,   Lungs:   Clear to auscultation bilaterally, respirations unlabored  Heart:  Regular rate and rhythm, S1, S2 normal  Abdomen:   Mild epigastric tenderness to palpation, soft, nontender, nondistended, bowel sounds present  Extremities: Extremities normal, atraumatic, no  edema       Lab Results: Recent Labs    11/08/17 0627 11/09/17 0556  NA 137 136  K 3.9 3.6  CL 108 106  CO2 22 22  GLUCOSE 125* 113*  BUN 7 5*  CREATININE 0.75 0.74  CALCIUM 8.8* 7.9*   Recent Labs    11/08/17 0627 11/09/17 0556  AST 39 25  ALT 119* 75*  ALKPHOS 87 71  BILITOT 0.5 0.8  PROT 6.4* 5.3*  ALBUMIN 3.6 3.0*   Recent Labs    11/08/17 0627 11/09/17 0556  WBC 22.0* 13.4*  HGB 13.3 11.6*  HCT 41.2 36.5  MCV 97.2 98.1  PLT 275 212   No results for input(s): LABPROT, INR in the last 72 hours.    Assessment/Plan: - choledocholithiasis. Status post ERCP with sphincterotomy and stone removal yesterday. Patient had  pancreatic duct cannulation as well as injection. Status post pancreatic duct stent placement. - epigastric abdominal pain. Most likely post-ERCP pancreatitis - constipation  Recommendations ----------------------- - continued to gentle hydration. - Started on clear liquid diet, okay to advance to full  liquid diet in the evening if able to tolerate clear liquid diet. Nothing by mouth past midnight for possible cholecystectomy tomorrow. - start MiraLAX - GI will follow   Kathi Der MD, FACP 11/09/2017, 11:17 AM  Contact #  [phone]

## 2017-11-09 NOTE — Progress Notes (Signed)
PROGRESS NOTE    Courtney Clements  ZOX:[id number] DOB: 08-10-1970 DOA: 11/05/2017 PCP: No primary care provider on file.   Brief Narrative:  HPI On 11/06/2017 by Dr. Lyda Perone Courtney Clements is a 47 y.o. female with medical history significant of GERD.  Patient presents to the ED with c/o upper abd pain that radiated to back and chest.  Occurred around 1AM yesterday, lasted 20 min, resolved.  Second episode 1pm yesterday, 20 min resolved.  Then reocurred 4pm yesterday and has been constant since that time. Similar episode about 4 months ago, with 2 short episodes of pain, resolved and hasnt reoccurred since then. PSHx of umbilical hernia repair. Has family history of cholecystectomy in her daughter.  Interim history  Admitted and found to have gallstones and CBD stones. S/p ERCP. Pending lap chole.  Assessment & Plan   Abdominal pain/RUQ, possible secondary to cholelithiasis/choledocholithiasis -CT abdomen pelvis showed gallbladder wall thickening versus small amount of pericholecystic fluid. Similar appearance of prominent pancreatic duct and upper normal extrahepatic common bile duct. Soft tissue fullness at the uncinate process of the pancreas, probably due to adjacent nondistended duodenum. Sigmoid colon diverticular disease without acute inflammation -Right upper quadrant ultrasound showed gallstones with wall thickening.  Borderline to slightly enlarged common bile duct up to 7.5 mm. -Discussed with gastroenterology, Dr. Evette Cristal, recommended MRCP (noninvasive testing first) -MRCP showed cholelithiasis without definite evidence of acute cholecystitis.  Mild diffuse biliary ductal dilatation due to 2 tiny less than 5 mm calculi in the distal common bile duct.  Mild pancreatic ductal dilatation, without evidence of pancreatic divisum or pancreatic mass. -Continue empiric antibiotics with ciprofloxacin and Flagyl, IVF, pain control -Gastroenterology consulted and appreciated, s/p ERCP: Complete  removal accomplished by biliary sphincterotomy and balloon extraction.  One plastic stent placed in the ventral pancreatic duct.  Biliary sphincterotomy was performed.  Patient will need repeat x-ray in 1 week to confirm stent has passed.  Recommended Intra-Op cholangiogram to confirm no further CBD stones. -General surgery consulted and appreciated for possible lap chole -Continue dilaudid PRN -pending patient's pain on 5/5/, possible lap chole -placed on clear liquid diet -will add on famotidine  ERCP associated pancreatitis -Lipase rose to 175, down to 47 -continue IV fluids, pain management, and monitor -decreased IVF  Leukocytosis -Suspect secondary to the above, patient currently afebrile -Denies any urinary symptoms, shortness of breath or cough-doubt infection as patient currently on antibiotics as above -WBC improved to 13.4 (from 22) -will continue to monitor CBC  Elevated LFTs -Likely secondary to the above, LFTS trending downward/improving -Continue IV fluids, monitor CMP  Bradycardia -Telemetry monitoring shows no further bradycardia -currently not any medications that would affect her HR -asymptomatic  -TSH 3.22  Allergic reaction -occurred after MRCP/contrast -given benadryl and resolved  Headache -will order Fioricet and kpad  Seasonal allergies -Will order Claritin, Flonase, eyedrops  DVT Prophylaxis  SCDs  Code Status: Full  Family Communication: None at bedside  Disposition Plan: Admitted, pending improvement in pain, and lap chole. Suspect home when stable  Consultants Gastroenterology, Dr. Evette Cristal General surgery  Procedures  RUQ [place] MRCP ERCP  Antibiotics   Anti-infectives (From admission, onward)   Start     Dose/Rate Route Frequency Ordered Stop   11/06/17 1200  ciprofloxacin (CIPRO) IVPB 400 mg     400 mg 200 mL/hr over 60 Minutes Intravenous Every 12 hours 11/06/17 0447     11/06/17 0400  metroNIDAZOLE (FLAGYL) IVPB 500 mg     500  mg 100 mL/hr over 60 Minutes Intravenous Every 8 hours 11/06/17 0359     11/06/17 0045  ciprofloxacin (CIPRO) IVPB 400 mg     400 mg 200 mL/hr over 60 Minutes Intravenous  Once 11/06/17 0040 11/06/17 0250      Subjective:   Courtney Clements seen and examined today.  Continues to complaine of abdominal pain and nausea. Denies vomiting.  Denies chest pain, shortness of breath, dizziness.  Complains of 8 out of 10 headache in the front of her head as well as the back of the neck. Objective:   Vitals:   11/08/17 0415 11/08/17 1353 11/08/17 2112 11/09/17 0534  BP: 122/86 123/86 (!) 141/85 138/80  Pulse: (!) 57 63 61 (!) 57  Resp: Temp: 98.6 F (37 C) 98.7 F (37.1 C) 99.1 F (37.3 C) 99 F (37.2 C)  TempSrc: Oral Oral Oral Oral  SpO2: 100% 100% 98% 97%  Weight:      Height:        Intake/Output Summary (Last 24 hours) at 11/09/2017 1333 Last data filed at 11/09/2017 0320 Gross per 24 hour  Intake 3881.25 ml  Output -  Net 3881.25 ml   Filed Weights   11/05/17 1710 11/07/17 1259  Weight: 89.4 kg (197 lb) 89.4 kg (197 lb)   Exam  General: Well developed, well nourished, NAD, appears stated age  HEENT: NCAT, mucous membranes moist.   Neck: Supple  Cardiovascular: S1 S2 auscultated, RRR, no murmur.  Respiratory: Clear to auscultation bilaterally with equal chest rise  Abdomen: Soft, Epigastric TTP, nondistended, + bowel sounds  Extremities: warm dry without cyanosis clubbing or edema  Neuro: AAOx3, nonfocal  Psych: Normal affect and demeanor with intact judgement and insight  Data Reviewed: I have personally reviewed following labs and imaging studies  CBC: Recent Labs  Lab 11/05/17 1727 11/06/17 0611 11/08/17 0627 11/09/17 0556  WBC 13.4* 7.0 22.0* 13.4*  HGB 14.2 12.2 13.3 11.6*  HCT 42.5 37.4 41.2 36.5  MCV 96.2 96.1 97.2 98.1  PLT 288 216 275 212   Basic Metabolic Panel: Recent Labs  Lab 11/05/17 1727 11/06/17 0611 11/07/17 0406  11/08/17 0627 11/09/17 0556  NA 141 137 140 137 136  K 3.7 3.7 3.7 3.9 3.6  CL 108 109 111 108 106  CO2 25 21* GLUCOSE 91 102* 96 125* 113*  BUN 5*  CREATININE 0.92 0.54 0.79 0.75 0.74  CALCIUM 9.1 8.0* 8.2* 8.8* 7.9*   GFR: Estimated Creatinine Clearance: 97.1 mL/min (by C-G formula based on SCr of 0.74 mg/dL). Liver Function Tests: Recent Labs  Lab 11/05/17 2230 11/06/17 0611 11/07/17 0406 11/08/17 0627 11/09/17 0556  AST 275* 266* 80* 39 25  ALT 153* 205* 155* 119* 75*  ALKPHOS 77 72 81 87 71  BILITOT 2.5* 3.1* 0.9 0.5 0.8  PROT 6.9 5.3* 5.5* 6.4* 5.3*  ALBUMIN 3.9 3.0* 3.2* 3.6 3.0*   Recent Labs  Lab 11/05/17 2230 11/08/17 0627 11/09/17 0556  LIPASE 33 175* 47   No results for input(s): AMMONIA in the last 168 hours. Coagulation Profile: No results for input(s): INR, PROTIME in the last 168 hours. Cardiac Enzymes: No results for input(s): CKTOTAL, CKMB, CKMBINDEX, TROPONINI in the last 168 hours. BNP (last 3 results) No results for input(s): PROBNP in the last 8760 hours. HbA1C: No results for input(s): HGBA1C in the last 72 hours. CBG: No results for input(s): GLUCAP in the last 168  hours. Lipid Profile: No results for input(s): CHOL, HDL, LDLCALC, TRIG, CHOLHDL, LDLDIRECT in the last 72 hours. Thyroid Function Tests: Recent Labs    11/07/17 1027  TSH 3.220   Anemia Panel: No results for input(s): VITAMINB12, FOLATE, FERRITIN, TIBC, IRON, RETICCTPCT in the last 72 hours. Urine analysis:    Component Value Date/Time   COLORURINE AMBER (A) 11/06/2017 0944   APPEARANCEUR CLEAR 11/06/2017 0944   LABSPEC >1.046 (H) 11/06/2017 0944   PHURINE 5.0 11/06/2017 0944   GLUCOSEU NEGATIVE 11/06/2017 0944   HGBUR NEGATIVE 11/06/2017 0944   BILIRUBINUR SMALL (A) 11/06/2017 0944   KETONESUR 5 (A) 11/06/2017 0944   PROTEINUR NEGATIVE 11/06/2017 0944   UROBILINOGEN 0.2 03/12/2014 1122   NITRITE NEGATIVE 11/06/2017 0944   LEUKOCYTESUR  NEGATIVE 11/06/2017 0944   Sepsis Labs: (procalcitonin:4,lacticidven:4)  ) Recent Results (from the past 240 hour(s))  Culture, Urine     Status: None   Collection Time: 11/06/17  9:44 AM  Result Value Ref Range Status   Specimen Description   Final    URINE, CLEAN CATCH Performed at [facility], [address] [place], [place] [address]    Special Requests   Final    NONE Performed at [facility], [address], [place], [place] [address]    Culture   Final    NO GROWTH Performed at [facility], [address], [place], [place] [address]    Report Status 11/07/2017 FINAL  Final  MRSA PCR Screening     Status: None   Collection Time: 11/07/17  4:28 PM  Result Value Ref Range Status   MRSA by PCR NEGATIVE NEGATIVE Final    Comment:        The GeneXpert MRSA Assay (FDA approved for NASAL specimens only), is one component of a comprehensive MRSA colonization surveillance program. It is not intended to diagnose MRSA infection nor to guide or monitor treatment for MRSA infections. Performed at [facility], [address], [place], [place] [address]       Radiology Studies: Dg Ercp Biliary & Pancreatic Ducts  Result Date: 11/07/2017 CLINICAL DATA:  47 year old female with choledocholithiasis EXAM: ERCP TECHNIQUE: Multiple spot images obtained with the fluoroscopic device and submitted for interpretation post-procedure. FLUOROSCOPY TIME:  Fluoroscopy Time: 8 minutes 46 seconds for a total of 95.7 mGy COMPARISON:  MRCP 11/06/2017 FINDINGS: A total of 7 intraoperative saved images were obtained during ERCP and submitted for review. The images demonstrate a flexible endoscope in the descending duodenum with wire cannulation of the left intrahepatic ducts. Cholangiogram demonstrates a small filling defect in the distal common bile duct. Subsequent images demonstrate sphincterotomy and balloon sweep of the  common duct. IMPRESSION: ERCP with sphincterotomy and balloon sweep of the common duct. These images were submitted for radiologic interpretation only. Please see the procedural report for the amount of contrast and the fluoroscopy time utilized. Electronically Signed   By: Malachy Moan M.D.   On: 11/07/2017 14:55     Scheduled Meds: . Chlorhexidine Gluconate Cloth  6 each Topical Once  . fluticasone  1 spray Each Nare Daily  . heparin  5,000 Units Subcutaneous Once  . loratadine  10 mg Oral Daily  . polyethylene glycol  17 g Oral Daily   Continuous Infusions: . sodium chloride 250 mL/hr at 11/09/17 0905  . ciprofloxacin 400 mg (11/08/17 2358)  . famotidine (PEPCID) IV Stopped (11/09/17 1059)  . metronidazole 500 mg (11/09/17 1302)  LOS: 3 days   Time Spent in minutes   45 minutes  Shann Merrick D.O. on 11/09/2017 at 1:33 PM  Between 7am to 7pm - Pager - [phone]  After 7pm go to [email or web address] - password [email or web address]  And look for the night coverage person covering for me after hours  [facility] Office  [phone]

## 2017-11-10 DIAGNOSIS — K8500 Idiopathic acute pancreatitis without necrosis or infection: Secondary | ICD-10-CM

## 2017-11-10 DIAGNOSIS — R51 Headache: Secondary | ICD-10-CM

## 2017-11-10 LAB — CBC
HCT: 35 % — ABNORMAL LOW (ref 36.0–46.0)
Hemoglobin: 11.4 g/dL — ABNORMAL LOW (ref 12.0–15.0)
MCH: 31.7 pg (ref 26.0–34.0)
MCHC: 32.6 g/dL (ref 30.0–36.0)
MCV: 97.2 fL (ref 78.0–100.0)
PLATELETS: 212 10*3/uL (ref 150–400)
RBC: 3.6 MIL/uL — ABNORMAL LOW (ref 3.87–5.11)
RDW: 12.6 % (ref 11.5–15.5)
WBC: 10.4 10*3/uL (ref 4.0–10.5)

## 2017-11-10 LAB — COMPREHENSIVE METABOLIC PANEL
ALT: 61 U/L — ABNORMAL HIGH (ref 14–54)
AST: 23 U/L (ref 15–41)
Albumin: 2.9 g/dL — ABNORMAL LOW (ref 3.5–5.0)
Alkaline Phosphatase: 79 U/L (ref 38–126)
Anion gap: 8 (ref 5–15)
BUN: <5 mg/dL — ABNORMAL LOW (ref 6–20)
CHLORIDE: 106 mmol/L (ref 101–111)
CO2: 23 mmol/L (ref 22–32)
CREATININE: 0.7 mg/dL (ref 0.44–1.00)
Calcium: 8 mg/dL — ABNORMAL LOW (ref 8.9–10.3)
GFR calc Af Amer: >60 mL/min (ref >60)
Glucose, Bld: 88 mg/dL (ref 65–99)
POTASSIUM: 3.4 mmol/L — AB (ref 3.5–5.1)
Sodium: 137 mmol/L (ref 135–145)
Total Bilirubin: 0.6 mg/dL (ref 0.3–1.2)
Total Protein: 5.4 g/dL — ABNORMAL LOW (ref 6.5–8.1)

## 2017-11-10 MED ORDER — OXYCODONE HCL 5 MG PO TABS
5.0000 mg | ORAL_TABLET | ORAL | Status: DC | PRN
  Administered 2017-11-10 (×2): 5 mg via ORAL
  Administered 2017-11-11 – 2017-11-12 (×8): 10 mg via ORAL
  Filled 2017-11-10: qty 1
  Filled 2017-11-10 (×6): qty 2
  Filled 2017-11-10: qty 1
  Filled 2017-11-10 (×2): qty 2

## 2017-11-10 MED ORDER — POTASSIUM CHLORIDE CRYS ER 10 MEQ PO TBCR
40.0000 meq | EXTENDED_RELEASE_TABLET | Freq: Once | ORAL | Status: AC
  Administered 2017-11-10: 40 meq via ORAL
  Filled 2017-11-10: qty 4

## 2017-11-10 NOTE — Progress Notes (Signed)
General Surgery [facility]  Patient seen and examined.  Chart and history reviewed.  Patient with cholecystitis, cholelithiasis, and choledocholithiasis.  ERCP with successful removal of CBD stone.  Post procedure pancreatitis now resolving.  Labs returning to normal.  Patient notes mild epigastric and RUQ pain radiating to back.  Waxes and wanes - still requiring narcotics for relief.  Exam: mild tenderness to palpation epigastrium; no mass; possible small recurrence of umbilical hernia  Discussed option of surgery later today if OR available versus putting her on OR schedule for tomorrow morning.  Patient agrees with plans for surgery tomorrow AM if clinically improving.  Will enter orders and notify OR.  Orders for pre-op entered.  Oral oxycodone as needed for pain.  Clear liquid diet until midnight.  The risks and benefits of the procedure have been discussed at length with the patient.  The patient understands the proposed procedure, potential alternative treatments, and the course of recovery to be expected.  All of the patient's questions have been answered at this time.  The patient wishes to proceed with surgery.  Darnell Level, MD [facility] Surgery Office: [phone]

## 2017-11-10 NOTE — Progress Notes (Signed)
PROGRESS NOTE    Courtney Clements  UJW:[id number] DOB: 1970/11/04 DOA: 11/05/2017 PCP: No primary care provider on file.   Brief Narrative:  HPI On 11/06/2017 by Dr. Lyda Perone Courtney Clements is a 47 y.o. female with medical history significant of GERD.  Patient presents to the ED with c/o upper abd pain that radiated to back and chest.  Occurred around 1AM yesterday, lasted 20 min, resolved.  Second episode 1pm yesterday, 20 min resolved.  Then reocurred 4pm yesterday and has been constant since that time. Similar episode about 4 months ago, with 2 short episodes of pain, resolved and hasnt reoccurred since then. PSHx of umbilical hernia repair. Has family history of cholecystectomy in her daughter.  Interim history  Admitted and found to have gallstones and CBD stones. S/p ERCP. Pending lap chole.  Assessment & Plan   Abdominal pain/RUQ, possible secondary to cholelithiasis/choledocholithiasis -CT abdomen pelvis showed gallbladder wall thickening versus small amount of pericholecystic fluid. Similar appearance of prominent pancreatic duct and upper normal extrahepatic common bile duct. Soft tissue fullness at the uncinate process of the pancreas, probably due to adjacent nondistended duodenum. Sigmoid colon diverticular disease without acute inflammation -Right upper quadrant ultrasound showed gallstones with wall thickening.  Borderline to slightly enlarged common bile duct up to 7.5 mm. -Discussed with gastroenterology, Dr. Evette Cristal, recommended MRCP (noninvasive testing first) -MRCP showed cholelithiasis without definite evidence of acute cholecystitis.  Mild diffuse biliary ductal dilatation due to 2 tiny less than 5 mm calculi in the distal common bile duct.  Mild pancreatic ductal dilatation, without evidence of pancreatic divisum or pancreatic mass. -Continue empiric antibiotics with ciprofloxacin and Flagyl, IVF, pain control -Gastroenterology consulted and appreciated, s/p ERCP: Complete  removal accomplished by biliary sphincterotomy and balloon extraction.  One plastic stent placed in the ventral pancreatic duct.  Biliary sphincterotomy was performed.  Patient will need repeat x-ray in 1 week to confirm stent has passed.  Recommended Intra-Op cholangiogram to confirm no further CBD stones. -General surgery consulted and appreciated for possible lap chole -Continue dilaudid PRN -pain has mildly improved, still requiring IV meds; oxycodone added today  -placed on clear liquid diet -continue famotidine  ERCP associated pancreatitis -Lipase rose to 175, down to 47 -continue IV fluids, pain management, and monitor  Leukocytosis -Resolved -Suspect secondary to the above, patient currently afebrile -Denies any urinary symptoms, shortness of breath or cough-doubt infection as patient currently on antibiotics as above -WBC improved from 22 to 10.4  Elevated LFTs -Likely secondary to the above, LFTS trending downward/improving -Continue IV fluids, monitor CMP  Bradycardia -Telemetry monitoring shows no further bradycardia -currently not any medications that would affect her HR -asymptomatic  -TSH 3.22  Allergic reaction -occurred after MRCP/contrast -given benadryl and resolved  Headache -Improving, continue Fioricet and kpad  Seasonal allergies -Improved, continue Claritin, Flonase, eyedrops  DVT Prophylaxis  SCDs  Code Status: Full  Family Communication: None at bedside  Disposition Plan: Admitted, pending lap chole- possibly 5/6 AM. Suspect home when stable  Consultants Gastroenterology, Dr. Evette Cristal General surgery  Procedures  RUQ [place] MRCP ERCP  Antibiotics   Anti-infectives (From admission, onward)   Start     Dose/Rate Route Frequency Ordered Stop   11/06/17 1200  ciprofloxacin (CIPRO) IVPB 400 mg     400 mg 200 mL/hr over 60 Minutes Intravenous Every 12 hours 11/06/17 0447     11/06/17 0400  metroNIDAZOLE (FLAGYL) IVPB 500 mg     500 mg 100  mL/hr over 60  Minutes Intravenous Every 8 hours 11/06/17 0359     11/06/17 0045  ciprofloxacin (CIPRO) IVPB 400 mg     400 mg 200 mL/hr over 60 Minutes Intravenous  Once 11/06/17 0040 11/06/17 0250      Subjective:   Courtney Clements seen and examined today.  Feels pain comes and goes, however has not used as much IV pain medication.  Feels headaches have improved.  Denies current nausea or vomiting, chest pain or shortness of breath. Objective:   Vitals:   11/09/17 0534 11/09/17 1416 11/09/17 2011 11/10/17 0522  BP: 138/80 (!) 130/96 (!) 145/95 (!) 111/99  Pulse: (!) 57 71 (!) 56 (!) 51  Resp: Temp: 99 F (37.2 C) 98 F (36.7 C) 98.4 F (36.9 C) 98.3 F (36.8 C)  TempSrc: Oral Oral Oral Oral  SpO2: 97% 100% 100% 100%  Weight:      Height:        Intake/Output Summary (Last 24 hours) at 11/10/2017 1035 Last data filed at 11/09/2017 1800 Gross per 24 hour  Intake 1400 ml  Output -  Net 1400 ml   Filed Weights   11/05/17 1710 11/07/17 1259  Weight: 89.4 kg (197 lb) 89.4 kg (197 lb)   Exam  General: Well developed, well nourished, NAD, appears stated age  HEENT: NCAT,mucous membranes moist.   Neck: Supple  Cardiovascular: S1 S2 auscultated, RRR, no murmur  Respiratory: Clear to auscultation bilaterally with equal chest rise  Abdomen: Soft, nontender, nondistended, + bowel sounds  Extremities: warm dry without cyanosis clubbing or edema  Neuro: AAOx3, nonfocal  Psych: Normal affect and demeanor, pleasant   Data Reviewed: I have personally reviewed following labs and imaging studies  CBC: Recent Labs  Lab 11/05/17 1727 11/06/17 0611 11/08/17 0627 11/09/17 0556 11/10/17 0516  WBC 13.4* 7.0 22.0* 13.4* 10.4  HGB 14.2 12.2 13.3 11.6* 11.4*  HCT 42.5 37.4 41.2 36.5 35.0*  MCV 96.2 96.1 97.2 98.1 97.2  PLT 288 216 275 212 212   Basic Metabolic Panel: Recent Labs  Lab 11/06/17 0611 11/07/17 0406 11/08/17 0627 11/09/17 0556 11/10/17 0516  NA  137 140 137 136 137  K 3.7 3.7 3.9 3.6 3.4*  CL 109 111 108 106 106  CO2 21* GLUCOSE 102* 96 125* 113* 88  BUN 5* <5*  CREATININE 0.54 0.79 0.75 0.74 0.70  CALCIUM 8.0* 8.2* 8.8* 7.9* 8.0*   GFR: Estimated Creatinine Clearance: 97.1 mL/min (by C-G formula based on SCr of 0.7 mg/dL). Liver Function Tests: Recent Labs  Lab 11/06/17 0611 11/07/17 0406 11/08/17 0627 11/09/17 0556 11/10/17 0516  AST 266* 80* 39 25 23  ALT 205* 155* 119* 75* 61*  ALKPHOS 72 81 87 71 79  BILITOT 3.1* 0.9 0.5 0.8 0.6  PROT 5.3* 5.5* 6.4* 5.3* 5.4*  ALBUMIN 3.0* 3.2* 3.6 3.0* 2.9*   Recent Labs  Lab 11/05/17 2230 11/08/17 0627 11/09/17 0556  LIPASE 33 175* 47   No results for input(s): AMMONIA in the last 168 hours. Coagulation Profile: No results for input(s): INR, PROTIME in the last 168 hours. Cardiac Enzymes: No results for input(s): CKTOTAL, CKMB, CKMBINDEX, TROPONINI in the last 168 hours. BNP (last 3 results) No results for input(s): PROBNP in the last 8760 hours. HbA1C: No results for input(s): HGBA1C in the last 72 hours. CBG: No results for input(s): GLUCAP in the last 168 hours. Lipid Profile: No results for input(s): CHOL, HDL,  LDLCALC, TRIG, CHOLHDL, LDLDIRECT in the last 72 hours. Thyroid Function Tests: No results for input(s): TSH, T4TOTAL, FREET4, T3FREE, THYROIDAB in the last 72 hours. Anemia Panel: No results for input(s): VITAMINB12, FOLATE, FERRITIN, TIBC, IRON, RETICCTPCT in the last 72 hours. Urine analysis:    Component Value Date/Time   COLORURINE AMBER (A) 11/06/2017 0944   APPEARANCEUR CLEAR 11/06/2017 0944   LABSPEC >1.046 (H) 11/06/2017 0944   PHURINE 5.0 11/06/2017 0944   GLUCOSEU NEGATIVE 11/06/2017 0944   HGBUR NEGATIVE 11/06/2017 0944   BILIRUBINUR SMALL (A) 11/06/2017 0944   KETONESUR 5 (A) 11/06/2017 0944   PROTEINUR NEGATIVE 11/06/2017 0944   UROBILINOGEN 0.2 03/12/2014 1122   NITRITE NEGATIVE 11/06/2017 0944   LEUKOCYTESUR  NEGATIVE 11/06/2017 0944   Sepsis Labs: (procalcitonin:4,lacticidven:4)  ) Recent Results (from the past 240 hour(s))  Culture, Urine     Status: None   Collection Time: 11/06/17  9:44 AM  Result Value Ref Range Status   Specimen Description   Final    URINE, CLEAN CATCH Performed at [facility], [address], [place], [place] [address]    Special Requests   Final    NONE Performed at [facility], [address] [place], [place] [address]    Culture   Final    NO GROWTH Performed at [facility], [address], [place], [place] [address]    Report Status 11/07/2017 FINAL  Final  MRSA PCR Screening     Status: None   Collection Time: 11/07/17  4:28 PM  Result Value Ref Range Status   MRSA by PCR NEGATIVE NEGATIVE Final    Comment:        The GeneXpert MRSA Assay (FDA approved for NASAL specimens only), is one component of a comprehensive MRSA colonization surveillance program. It is not intended to diagnose MRSA infection nor to guide or monitor treatment for MRSA infections. Performed at [facility], [address], [place], [place] [address]       Radiology Studies: No results found.   Scheduled Meds: . Chlorhexidine Gluconate Cloth  6 each Topical Once  . fluticasone  1 spray Each Nare Daily  . loratadine  10 mg Oral Daily  . polyethylene glycol  17 g Oral Daily   Continuous Infusions: . sodium chloride 125 mL/hr at 11/10/17 0651  . ciprofloxacin 400 mg (11/10/17 0031)  . famotidine (PEPCID) IV Stopped (11/09/17 2255)  . metronidazole 500 mg (11/10/17 0359)     LOS: 4 days   Time Spent in minutes   30 minutes  Gevork Ayyad D.O. on 11/10/2017 at 10:35 AM  Between 7am to 7pm - Pager - [phone]  After 7pm go to [email or web address] - password [email or web address]  And look for the night coverage person covering for me after hours  [facility] Office  [phone]

## 2017-11-10 NOTE — Progress Notes (Signed)
[facility] Gastroenterology Progress Note  Courtney Clements 47 y.o. 23-Sep-1970  CC:   choledocholithiasis   Subjective:  She continues to have intermittent abdominal pain but has improved in last 2 days. Denied any nausea or vomiting. Tolerating clear liquids diet. Denied any fever or chills.  ROS : negative for chest pain and  shortness of breath.   Objective: Vital signs in last 24 hours: Vitals:   11/09/17 2011 11/10/17 0522  BP: (!) 145/95 (!) 111/99  Pulse: (!) 56 (!) 51  Resp: 17 18  Temp: 98.4 F (36.9 C) 98.3 F (36.8 C)  SpO2: 100% 100%    Physical Exam:  General:  Alert, cooperative, no distress, appears stated age  Head:  Normocephalic, without obvious abnormality, atraumatic  Eyes:  , EOM's intact,   Lungs:   Clear to auscultation bilaterally, respirations unlabored  Heart:  Regular rate and rhythm, S1, S2 normal  Abdomen:   Mild epigastric tenderness to palpation, soft, nontender, nondistended, bowel sounds present  Extremities: Extremities normal, atraumatic, no  edema       Lab Results: Recent Labs    11/09/17 0556 11/10/17 0516  NA 136 137  K 3.6 3.4*  CL 106 106  CO2 22 23  GLUCOSE 113* 88  BUN 5* <5*  CREATININE 0.74 0.70  CALCIUM 7.9* 8.0*   Recent Labs    11/09/17 0556 11/10/17 0516  AST 25 23  ALT 75* 61*  ALKPHOS 71 79  BILITOT 0.8 0.6  PROT 5.3* 5.4*  ALBUMIN 3.0* 2.9*   Recent Labs    11/09/17 0556 11/10/17 0516  WBC 13.4* 10.4  HGB 11.6* 11.4*  HCT 36.5 35.0*  MCV 98.1 97.2  PLT 212 212   No results for input(s): LABPROT, INR in the last 72 hours.    Assessment/Plan: - choledocholithiasis. Status post ERCP with sphincterotomy and stone removal yesterday. Patient had  pancreatic duct cannulation as well as injection. Status post pancreatic duct stent placement. - epigastric abdominal pain. Most likely post-ERCP pancreatitis - constipation - mild elevated ALP. Recommend repeat LFTs in 4 weeks as an  outpatient  Recommendations ----------------------- - leukocytosis resolved. Abdominal pain improving. - start full liquid diet today. Nothing by mouth past midnight for cholecystectomy tomorrow. - continue MiraLAX - Recommend repeat LFTs in 4 weeks as an outpatient - GI will follow   Kathi Der MD, FACP 11/10/2017, 12:05 PM  Contact #  [phone]

## 2017-11-10 NOTE — Progress Notes (Signed)
Subjective Underwent ERCP Thur with clearing of stones from CBD. Had post ERCP pancreatitis.  Pain and bloating have resolved.   Objective: Vital signs in last 24 hours: Temp:  [98 F (36.7 C)-98.4 F (36.9 C)] 98.3 F (36.8 C) (05/05 0522) Pulse Rate:  [51-71] 51 (05/05 0522) Resp:  [17-18] 18 (05/05 0522) BP: (111-145)/(95-99) 111/99 (05/05 0522) SpO2:  [100 %] 100 % (05/05 0522) Last BM Date: (P) 11/07/17  Intake/Output from previous day: 05/04 0701 - 05/05 0700 In: 2087.5 [I.V.:1687.5; IV Piggyback:400] Out: -  Intake/Output this shift: No intake/output data recorded.  Gen: NAD, uncomfortable Abd: Soft, moderately not distended, no ttp Ext: SCDs in place  Lab Results: CBC  Recent Labs    11/09/17 0556 11/10/17 0516  WBC 13.4* 10.4  HGB 11.6* 11.4*  HCT 36.5 35.0*  PLT 212 212   BMET Recent Labs    11/09/17 0556 11/10/17 0516  NA 136 137  K 3.6 3.4*  CL 106 106  CO2 22 23  GLUCOSE 113* 88  BUN 5* <5*  CREATININE 0.74 0.70  CALCIUM 7.9* 8.0*   PT/INR No results for input(s): LABPROT, INR in the last 72 hours. ABG No results for input(s): PHART, HCO3 in the last 72 hours.  Invalid input(s): PCO2, PO2  Studies/Results:  Anti-infectives: Anti-infectives (From admission, onward)   Start     Dose/Rate Route Frequency Ordered Stop   11/06/17 1200  ciprofloxacin (CIPRO) IVPB 400 mg     400 mg 200 mL/hr over 60 Minutes Intravenous Every 12 hours 11/06/17 0447     11/06/17 0400  metroNIDAZOLE (FLAGYL) IVPB 500 mg     500 mg 100 mL/hr over 60 Minutes Intravenous Every 8 hours 11/06/17 0359     11/06/17 0045  ciprofloxacin (CIPRO) IVPB 400 mg     400 mg 200 mL/hr over 60 Minutes Intravenous  Once 11/06/17 0040 11/06/17 0250       Assessment/Plan: Patient Active Problem List   Diagnosis Date Noted  . Acute cholecystitis 11/06/2017  . Transaminitis 11/06/2017  . Calculus of gallbladder with acute cholecystitis without obstruction 11/06/2017  .  Abnormal magnetic resonance cholangiopancreatography (MRCP)   . DUB (dysfunctional uterine bleeding) 03/26/2014   s/p Procedure(s): ENDOSCOPIC RETROGRADE CHOLANGIOPANCREATOGRAPHY (ERCP) 11/07/2017 Ms. Frosch is a pleasant 47yoF with choledocholithiasis s/p ERCP 11/07/17 -Probable post ERCP pancreatitis - lipase 175 and wbc bumped to 22; clinically has sxs consistent with this.  Labs have trended down, symptoms are resolving now as well -Cholecystectomy anticipated this admission - we will either do this later this afternoon or first thing tom AM   Vanita Panda, MD  Colorectal and General Surgery [facility]

## 2017-11-11 ENCOUNTER — Encounter (HOSPITAL_COMMUNITY): Payer: Self-pay | Admitting: Anesthesiology

## 2017-11-11 ENCOUNTER — Inpatient Hospital Stay (HOSPITAL_COMMUNITY): Payer: Self-pay | Admitting: Anesthesiology

## 2017-11-11 ENCOUNTER — Encounter (HOSPITAL_COMMUNITY)
Admission: EM | Disposition: A | Discharge status: Home / Self Care | Payer: Self-pay | Source: Home / Self Care | Attending: Internal Medicine

## 2017-11-11 HISTORY — PX: CHOLECYSTECTOMY: SHX55

## 2017-11-11 LAB — COMPREHENSIVE METABOLIC PANEL
ALT: 47 U/L (ref 14–54)
AST: 16 U/L (ref 15–41)
Albumin: 2.9 g/dL — ABNORMAL LOW (ref 3.5–5.0)
Alkaline Phosphatase: 66 U/L (ref 38–126)
Anion gap: 8 (ref 5–15)
CHLORIDE: 108 mmol/L (ref 101–111)
CO2: 24 mmol/L (ref 22–32)
CREATININE: 0.63 mg/dL (ref 0.44–1.00)
Calcium: 8.2 mg/dL — ABNORMAL LOW (ref 8.9–10.3)
GFR calc Af Amer: >60 mL/min (ref >60)
GFR calc non Af Amer: >60 mL/min (ref >60)
Glucose, Bld: 88 mg/dL (ref 65–99)
Potassium: 3.7 mmol/L (ref 3.5–5.1)
SODIUM: 140 mmol/L (ref 135–145)
TOTAL PROTEIN: 5.2 g/dL — AB (ref 6.5–8.1)
Total Bilirubin: 0.7 mg/dL (ref 0.3–1.2)

## 2017-11-11 LAB — CBC
HCT: 34.7 % — ABNORMAL LOW (ref 36.0–46.0)
Hemoglobin: 11.2 g/dL — ABNORMAL LOW (ref 12.0–15.0)
MCH: 31.2 pg (ref 26.0–34.0)
MCHC: 32.3 g/dL (ref 30.0–36.0)
MCV: 96.7 fL (ref 78.0–100.0)
PLATELETS: 206 10*3/uL (ref 150–400)
RBC: 3.59 MIL/uL — AB (ref 3.87–5.11)
RDW: 12.3 % (ref 11.5–15.5)
WBC: 8.7 10*3/uL (ref 4.0–10.5)

## 2017-11-11 SURGERY — LAPAROSCOPIC CHOLECYSTECTOMY WITH INTRAOPERATIVE CHOLANGIOGRAM
Anesthesia: General | Site: Abdomen

## 2017-11-11 MED ORDER — ROCURONIUM BROMIDE 10 MG/ML (PF) SYRINGE
PREFILLED_SYRINGE | INTRAVENOUS | Status: DC | PRN
  Administered 2017-11-11: 10 mg via INTRAVENOUS
  Administered 2017-11-11: 60 mg via INTRAVENOUS

## 2017-11-11 MED ORDER — BUPIVACAINE HCL (PF) 0.25 % IJ SOLN
INTRAMUSCULAR | Status: DC | PRN
  Administered 2017-11-11: 15 mL

## 2017-11-11 MED ORDER — DEXAMETHASONE SODIUM PHOSPHATE 10 MG/ML IJ SOLN
INTRAMUSCULAR | Status: DC | PRN
  Administered 2017-11-11: 10 mg via INTRAVENOUS

## 2017-11-11 MED ORDER — CIPROFLOXACIN IN D5W 400 MG/200ML IV SOLN: 400.0000 mg | INTRAVENOUS | Status: DC

## 2017-11-11 MED ORDER — PROPOFOL 10 MG/ML IV BOLUS
INTRAVENOUS | Status: AC
  Filled 2017-11-11: qty 20

## 2017-11-11 MED ORDER — BUPIVACAINE HCL (PF) 0.25 % IJ SOLN
INTRAMUSCULAR | Status: AC
  Filled 2017-11-11: qty 30

## 2017-11-11 MED ORDER — IOPAMIDOL (ISOVUE-300) INJECTION 61%
INTRAVENOUS | Status: AC
  Filled 2017-11-11: qty 50

## 2017-11-11 MED ORDER — LACTATED RINGERS IV SOLN
INTRAVENOUS | Status: DC | PRN
  Administered 2017-11-11: 1000 mL via INTRAVENOUS
  Administered 2017-11-11: 09:00:00 via INTRAVENOUS

## 2017-11-11 MED ORDER — GLYCOPYRROLATE 0.2 MG/ML IV SOSY
PREFILLED_SYRINGE | INTRAVENOUS | Status: DC | PRN
  Administered 2017-11-11: .2 mg via INTRAVENOUS

## 2017-11-11 MED ORDER — PROPOFOL 500 MG/50ML IV EMUL
INTRAVENOUS | Status: DC | PRN
  Administered 2017-11-11: 25 ug/kg/min via INTRAVENOUS

## 2017-11-11 MED ORDER — SUGAMMADEX SODIUM 200 MG/2ML IV SOLN
INTRAVENOUS | Status: DC | PRN
  Administered 2017-11-11: 200 mg via INTRAVENOUS

## 2017-11-11 MED ORDER — FENTANYL CITRATE (PF) 100 MCG/2ML IJ SOLN
INTRAMUSCULAR | Status: AC
  Filled 2017-11-11: qty 2

## 2017-11-11 MED ORDER — ESMOLOL HCL 100 MG/10ML IV SOLN
INTRAVENOUS | Status: DC | PRN
  Administered 2017-11-11 (×4): 10 mg via INTRAVENOUS

## 2017-11-11 MED ORDER — MIDAZOLAM HCL 2 MG/2ML IJ SOLN
INTRAMUSCULAR | Status: AC
  Filled 2017-11-11: qty 2

## 2017-11-11 MED ORDER — PROMETHAZINE HCL 25 MG/ML IJ SOLN: 6.2500 mg | INTRAMUSCULAR | Status: DC | PRN

## 2017-11-11 MED ORDER — 0.9 % SODIUM CHLORIDE (POUR BTL) OPTIME
TOPICAL | Status: DC | PRN
  Administered 2017-11-11: 1000 mL

## 2017-11-11 MED ORDER — PROPOFOL 10 MG/ML IV BOLUS
INTRAVENOUS | Status: DC | PRN
  Administered 2017-11-11: 160 mg via INTRAVENOUS

## 2017-11-11 MED ORDER — SUGAMMADEX SODIUM 200 MG/2ML IV SOLN
INTRAVENOUS | Status: AC
  Filled 2017-11-11: qty 2

## 2017-11-11 MED ORDER — LACTATED RINGERS IR SOLN
Status: DC | PRN
  Administered 2017-11-11: 1000 mL

## 2017-11-11 MED ORDER — METRONIDAZOLE IN NACL 5-0.79 MG/ML-% IV SOLN
INTRAVENOUS | Status: AC
  Filled 2017-11-11: qty 100

## 2017-11-11 MED ORDER — MIDAZOLAM HCL 5 MG/5ML IJ SOLN
INTRAMUSCULAR | Status: DC | PRN
  Administered 2017-11-11: 2 mg via INTRAVENOUS

## 2017-11-11 MED ORDER — SCOPOLAMINE 1 MG/3DAYS TD PT72
MEDICATED_PATCH | TRANSDERMAL | Status: AC
  Administered 2017-11-11: 1.5 mg
  Filled 2017-11-11: qty 1

## 2017-11-11 MED ORDER — FENTANYL CITRATE (PF) 100 MCG/2ML IJ SOLN
INTRAMUSCULAR | Status: DC | PRN
  Administered 2017-11-11 (×2): 50 ug via INTRAVENOUS
  Administered 2017-11-11: 25 ug via INTRAVENOUS
  Administered 2017-11-11: 50 ug via INTRAVENOUS
  Administered 2017-11-11: 25 ug via INTRAVENOUS
  Administered 2017-11-11 (×2): 50 ug via INTRAVENOUS

## 2017-11-11 MED ORDER — FENTANYL CITRATE (PF) 100 MCG/2ML IJ SOLN
25.0000 ug | INTRAMUSCULAR | Status: DC | PRN
  Administered 2017-11-11: 50 ug via INTRAVENOUS
  Administered 2017-11-11: 25 ug via INTRAVENOUS

## 2017-11-11 MED ORDER — HYDRALAZINE HCL 20 MG/ML IJ SOLN
10.0000 mg | Freq: Once | INTRAMUSCULAR | Status: AC
  Administered 2017-11-11: 10 mg via INTRAVENOUS

## 2017-11-11 MED ORDER — LIDOCAINE 2% (20 MG/ML) 5 ML SYRINGE
INTRAMUSCULAR | Status: DC | PRN
Start: 2017-11-11 — End: 2017-11-11
  Administered 2017-11-11: 100 mg via INTRAVENOUS

## 2017-11-11 MED ORDER — DIPHENHYDRAMINE HCL 50 MG/ML IJ SOLN
INTRAMUSCULAR | Status: AC
  Filled 2017-11-11: qty 1

## 2017-11-11 MED ORDER — DIPHENHYDRAMINE HCL 50 MG/ML IJ SOLN
INTRAMUSCULAR | Status: DC | PRN
  Administered 2017-11-11: 12.5 mg via INTRAVENOUS

## 2017-11-11 MED ORDER — SCOPOLAMINE 1 MG/3DAYS TD PT72: 1.0000 | MEDICATED_PATCH | Freq: Once | TRANSDERMAL | Status: DC

## 2017-11-11 MED ORDER — SUGAMMADEX SODIUM 500 MG/5ML IV SOLN
INTRAVENOUS | Status: AC
  Filled 2017-11-11: qty 5

## 2017-11-11 MED ORDER — LACTATED RINGERS IV SOLN
INTRAVENOUS | Status: DC | PRN
  Administered 2017-11-11: 10:00:00 via INTRAVENOUS

## 2017-11-11 SURGICAL SUPPLY — 28 items
APPLIER CLIP 5 13 M/L LIGAMAX5 (MISCELLANEOUS) ×3
CABLE HIGH FREQUENCY MONO STRZ (ELECTRODE) ×3 IMPLANT
CATH REDDICK CHOLANGI 4FR 50CM (CATHETERS) IMPLANT
CHLORAPREP W/TINT 26ML (MISCELLANEOUS) ×3 IMPLANT
CLIP APPLIE 5 13 M/L LIGAMAX5 (MISCELLANEOUS) ×1 IMPLANT
COVER MAYO STAND STRL (DRAPES) IMPLANT
DECANTER SPIKE VIAL GLASS SM (MISCELLANEOUS) ×3 IMPLANT
DERMABOND ADVANCED (GAUZE/BANDAGES/DRESSINGS) ×2
DERMABOND ADVANCED .7 DNX12 (GAUZE/BANDAGES/DRESSINGS) ×1 IMPLANT
DEVICE PMI PUNCTURE CLOSURE (MISCELLANEOUS) ×3 IMPLANT
DRAPE C-ARM 42X120 X-RAY (DRAPES) IMPLANT
ELECT REM PT RETURN 15FT ADLT (MISCELLANEOUS) ×3 IMPLANT
GLOVE BIO SURGEON STRL SZ7.5 (GLOVE) ×3 IMPLANT
GOWN STRL REUS W/TWL XL LVL3 (GOWN DISPOSABLE) ×9 IMPLANT
HEMOSTAT SURGICEL 4X8 (HEMOSTASIS) IMPLANT
IV CATH 14GX2 1/4 (CATHETERS) ×3 IMPLANT
KIT BASIN OR (CUSTOM PROCEDURE TRAY) ×3 IMPLANT
POUCH SPECIMEN RETRIEVAL 10MM (ENDOMECHANICALS) ×3 IMPLANT
SCISSORS LAP 5X35 DISP (ENDOMECHANICALS) ×3 IMPLANT
SET IRRIG TUBING LAPAROSCOPIC (IRRIGATION / IRRIGATOR) ×3 IMPLANT
SLEEVE XCEL OPT CAN 5 100 (ENDOMECHANICALS) ×9 IMPLANT
SUT MNCRL AB 4-0 PS2 18 (SUTURE) ×3 IMPLANT
TOWEL OR 17X26 10 PK STRL BLUE (TOWEL DISPOSABLE) ×3 IMPLANT
TOWEL OR NON WOVEN STRL DISP B (DISPOSABLE) ×3 IMPLANT
TRAY LAPAROSCOPIC (CUSTOM PROCEDURE TRAY) ×3 IMPLANT
TROCAR BLADELESS OPT 5 100 (ENDOMECHANICALS) ×3 IMPLANT
TROCAR XCEL BLUNT TIP 100MML (ENDOMECHANICALS) IMPLANT
TUBING INSUF HEATED (TUBING) ×3 IMPLANT

## 2017-11-11 NOTE — Op Note (Signed)
11/05/2017 - 11/11/2017  11:29 AM  PATIENT:  Courtney Clements  47 y.o. female  PRE-OPERATIVE DIAGNOSIS:  Gallsone pancreatitis  POST-OPERATIVE DIAGNOSIS:  Gallstone pancreatitis  PROCEDURE:  Procedure(s): LAPAROSCOPIC CHOLECYSTECTOMY (N/A)  SURGEON:  Surgeon(s) and Role:    * Griselda Miner, MD - Primary  PHYSICIAN ASSISTANT:   ASSISTANTS: Wells Guiles, PA   ANESTHESIA:   local and general  EBL:  25 mL   BLOOD ADMINISTERED:none  DRAINS: none   LOCAL MEDICATIONS USED:  MARCAINE     SPECIMEN:  Source of Specimen:  gallbladder  DISPOSITION OF SPECIMEN:  PATHOLOGY  COUNTS:  YES  TOURNIQUET:  * No tourniquets in log *  DICTATION: .Dragon Dictation   After informed consent was obtained the patient was brought to the operating room and placed in the supine position on the operating table.  After adequate induction of general anesthesia the patient's abdomen was prepped with ChloraPrep, allowed to dry, and draped in usual sterile manner.  An appropriate timeout was performed.  Given her previous umbilical hernia repair with mesh I decided to access the abdominal cavity in the left upper quadrant with a 5 mm Optiview port and camera.  We were able to dissected the layers of the abdominal wall bluntly under direct vision until access was gained to the abdominal cavity.  The abdomen was then insufflated with carbon dioxide without difficulty.  The right upper quadrant was relatively free.  We then placed two 5 mm ports under direct vision in the right upper quadrant.  We were then able to look back at the left upper quadrant port site and we took down some adhesions sharply with laparoscopic scissors so that the area around the port was clean.  There was some significant omental adhesion to the mesh repairing the umbilicus centrally in the abdomen.  We then placed a 5 mm camera port just to the right and above the umbilicus.  Next a blunt grasper was placed to the lateralmost 5 mm port and  used to grasp the dome of the gallbladder and elevated anteriorly and superiorly.  Another blunt grasper was placed through the other 5 mm port and used to retract on the body and neck of the gallbladder.  A dissector was placed through the left upper quadrant port and using the electrocautery the peritoneal reflection at the gallbladder neck was opened.  Blunt dissection was carried out in this area until the gallbladder neck cystic duct junction was readily identified and a good window was created.  2 clips were placed proximally on the cystic duct and one on the gallbladder neck and the duct was divided between the 2.  Posterior this the cystic artery was identified and dissected circumferentially until a good window was created.  2 clips placed proximally one distally in the artery and the artery was divided between the 2.  Next the laparoscopic hook cautery device was used to separate the gallbladder from the liver bed.  Once it was completely detached from the liver bed the 5 mm left upper quadrant port was exchanged for a 11 mm port.  A laparoscopic bag was placed through this port and the gallbladder was placed in the bag and the bag was sealed.  The gallbladder and bag were then removed with the 11 mm port through the left upper quadrant port site without difficulty.  The port was replaced and the abdomen was inspected.  The liver bed was found to be hemostatic.  The right upper quadrant  was irrigated with copious amounts of saline to the effluent was clear.  No other abnormalities were noted on general inspection of the abdominal cavity.  The 11 mm port was removed and the fascial defect was closed with a 0 Vicryl stitch.  The rest of the ports were removed under direct vision and were found to be hemostatic.  The gas was allowed to escape.  The skin incisions were closed with interrupted 4-0 Monocryl subcuticular stitches.  Dermabond dressings were applied.  The patient tolerated the procedure well.  At  the end of the case all needle sponge and instrument counts were correct.  The patient was then awakened and taken to recovery in stable condition.  PLAN OF CARE: Admit to inpatient   PATIENT DISPOSITION:  PACU - hemodynamically stable.   Delay start of Pharmacological VTE agent (>24hrs) due to surgical blood loss or risk of bleeding: no

## 2017-11-11 NOTE — Anesthesia Postprocedure Evaluation (Signed)
Anesthesia Post Note  Patient: JAYLYNNE BIRKHEAD  Procedure(s) Performed: ENDOSCOPIC RETROGRADE CHOLANGIOPANCREATOGRAPHY (ERCP) (N/A )     Patient location during evaluation: PACU Anesthesia Type: General Level of consciousness: awake and alert Pain management: pain level controlled Vital Signs Assessment: post-procedure vital signs reviewed and stable Respiratory status: spontaneous breathing, nonlabored ventilation, respiratory function stable and patient connected to nasal cannula oxygen Cardiovascular status: blood pressure returned to baseline and stable Postop Assessment: no apparent nausea or vomiting Anesthetic complications: no    Last Vitals:  Vitals:   11/10/17 2116 11/11/17 0515  BP: (!) 163/63 (!) 148/83  Pulse: (!) 56 (!) 57  Resp: 18 19  Temp: 36.8 C 36.9 C  SpO2: 100% 99%    Last Pain:  Vitals:   11/11/17 0515  TempSrc: Oral  PainSc:                  Catheryn Bacon Ellender

## 2017-11-11 NOTE — Anesthesia Procedure Notes (Signed)
Procedure Name: Intubation Date/Time: 11/11/2017 10:19 AM Performed by: Lavina Hamman, CRNA Pre-anesthesia Checklist: Patient identified, Emergency Drugs available, Suction available, Patient being monitored and Timeout performed Patient Re-evaluated:Patient Re-evaluated prior to induction Oxygen Delivery Method: Circle system utilized Preoxygenation: Pre-oxygenation with 100% oxygen Induction Type: IV induction Ventilation: Mask ventilation without difficulty Laryngoscope Size: Mac and 4 Grade View: Grade I Tube type: Oral Tube size: 7.0 mm Number of attempts: 1 Airway Equipment and Method: Stylet Placement Confirmation: ETT inserted through vocal cords under direct vision,  positive ETCO2,  CO2 detector and breath sounds checked- equal and bilateral Secured at: 22 cm Tube secured with: Tape Dental Injury: Teeth and Oropharynx as per pre-operative assessment

## 2017-11-11 NOTE — Transfer of Care (Signed)
Immediate Anesthesia Transfer of Care Note  Patient: Courtney Clements  Procedure(s) Performed: Procedure(s): LAPAROSCOPIC CHOLECYSTECTOMY (N/A)  Patient Location: PACU  Anesthesia Type:General  Level of Consciousness:  sedated, patient cooperative and responds to stimulation  Airway & Oxygen Therapy:Patient Spontanous Breathing and Patient connected to face mask oxgen  Post-op Assessment:  Report given to PACU RN and Post -op Vital signs reviewed and stable  Post vital signs:  Reviewed and stable  Last Vitals:  Vitals:   11/11/17 0515 11/11/17 1141  BP: (!) 148/83   Pulse: (!) 57 98  Resp: 19 (!) 22  Temp: 36.9 C   SpO2: 99%     Complications: No apparent anesthesia complications

## 2017-11-11 NOTE — Progress Notes (Signed)
PROGRESS NOTE    Courtney Clements  ZOX:[id number] DOB: [birth year] DOA: 11/05/2017 PCP: No primary care provider on file.   Brief Narrative:  HPI On 11/06/2017 by Dr. Lyda Perone Courtney Clements is a 47 y.o. female with medical history significant of GERD.  Patient presents to the ED with c/o upper abd pain that radiated to back and chest.  Occurred around 1AM yesterday, lasted 20 min, resolved.  Second episode 1pm yesterday, 20 min resolved.  Then reocurred 4pm yesterday and has been constant since that time. Similar episode about 4 months ago, with 2 short episodes of pain, resolved and hasnt reoccurred since then. PSHx of umbilical hernia repair. Has family history of cholecystectomy in her daughter.  Interim history  Admitted and found to have gallstones and CBD stones. S/p ERCP. Pending lap chole.  Assessment & Plan   Abdominal pain/RUQ, possible secondary to cholelithiasis/choledocholithiasis -CT abdomen pelvis showed gallbladder wall thickening versus small amount of pericholecystic fluid. Similar appearance of prominent pancreatic duct and upper normal extrahepatic common bile duct. Soft tissue fullness at the uncinate process of the pancreas, probably due to adjacent nondistended duodenum. Sigmoid colon diverticular disease without acute inflammation -Right upper quadrant ultrasound showed gallstones with wall thickening.  Borderline to slightly enlarged common bile duct up to 7.5 mm. -Discussed with gastroenterology, Dr. Evette Cristal, recommended MRCP (noninvasive testing first) -MRCP showed cholelithiasis without definite evidence of acute cholecystitis.  Mild diffuse biliary ductal dilatation due to 2 tiny less than 5 mm calculi in the distal common bile duct.  Mild pancreatic ductal dilatation, without evidence of pancreatic divisum or pancreatic mass. -Continue empiric antibiotics with ciprofloxacin and Flagyl, IVF, pain control -Gastroenterology consulted and appreciated, s/p ERCP: Complete  removal accomplished by biliary sphincterotomy and balloon extraction.  One plastic stent placed in the ventral pancreatic duct.  Biliary sphincterotomy was performed.  Patient will need repeat x-ray in 1 week to confirm stent has passed.  Recommended Intra-Op cholangiogram to confirm no further CBD stones. -General surgery consulted and appreciated for possible lap chole- today 11/11/2017 -Continue pain control PRN -pain improved -continue famotidine  ERCP associated pancreatitis -Lipase rose to 175, down to 47 -resolved  Leukocytosis -Resolved -Suspect secondary to the above, patient currently afebrile -Denies any urinary symptoms, shortness of breath or cough-doubt infection as patient currently on antibiotics as above -WBC improved from 22 to 10.4  Elevated LFTs -Resolved, continue to monitor CMP  Bradycardia -Telemetry monitoring shows no further bradycardia- tele discontinued -however continuous pulse ox showed HR in the 50s with occ drop while sleeping; O2 saturations also dropping during sleep -likely needs outpatient sleep study -will order overnight pulse to be monitored an documented for possible home O2 -currently not any medications that would affect her HR -asymptomatic  -TSH 3.22  Allergic reaction -occurred after MRCP/contrast -given benadryl and resolved  Headache -Resolved- was placed on Fioricet and kpad -possibly secondary to IV medications   Seasonal allergies -Improved, continue Claritin, Flonase, eyedrops  DVT Prophylaxis  SCDs  Code Status: Full  Family Communication: None at bedside  Disposition Plan: Admitted, pending lap chole- today 5/6. Suspect home when stable  Consultants Gastroenterology, Dr. Evette Cristal General surgery  Procedures  RUQ [place] MRCP ERCP  Antibiotics   Anti-infectives (From admission, onward)   Start     Dose/Rate Route Frequency Ordered Stop   11/11/17 1002  metroNIDAZOLE (FLAGYL) 5-0.79 MG/ML-% IVPB    Note to Pharmacy:   Courtney Clements, Courtney Clements   : cabinet override  11/11/17 1002 11/11/17 2214   11/11/17 0040  ciprofloxacin (CIPRO) IVPB 400 mg     400 mg 200 mL/hr over 60 Minutes Intravenous On call to O.R. 11/11/17 0040 11/12/17 0559   11/06/17 1200  [MAR Hold]  ciprofloxacin (CIPRO) IVPB 400 mg     (MAR Hold since Mon 11/11/2017 at 0945. Reason: Transfer to a Procedural area.)   400 mg 200 mL/hr over 60 Minutes Intravenous Every 12 hours 11/06/17 0447     11/06/17 0400  [MAR Hold]  metroNIDAZOLE (FLAGYL) IVPB 500 mg     (MAR Hold since Mon 11/11/2017 at 0945. Reason: Transfer to a Procedural area.)   500 mg 100 mL/hr over 60 Minutes Intravenous Every 8 hours 11/06/17 0359     11/06/17 0045  ciprofloxacin (CIPRO) IVPB 400 mg     400 mg 200 mL/hr over 60 Minutes Intravenous  Once 11/06/17 0040 11/06/17 0250      Subjective:   Courtney Clements seen and examined today.  Feels abdominal pain has improved. Denies further nausea or vomiting, headache.  Denies current shortness of breath, chest pain, or dizziness. Ready for surgery today.   Objective:   Vitals:   11/10/17 1508 11/10/17 2116 11/11/17 0515 11/11/17 1000  BP: 127/87 (!) 163/63 (!) 148/83   Pulse: (!) 54 (!) 56 (!) 57   Resp: Temp: 98.5 F (36.9 C) 98.3 F (36.8 C) 98.4 F (36.9 C)   TempSrc: Oral Oral Oral   SpO2: 97% 100% 99%   Weight:    89.4 kg (197 lb)  Height:     (1.651 m)    Intake/Output Summary (Last 24 hours) at 11/11/2017 1010 Last data filed at 11/11/2017 0044 Gross per 24 hour  Intake 0 ml  Output -  Net 0 ml   Filed Weights   11/05/17 1710 11/07/17 1259 11/11/17 1000  Weight: 89.4 kg (197 lb) 89.4 kg (197 lb) 89.4 kg (197 lb)   Exam  General: Well developed, well nourished, NAD, appears stated age  HEENT: NCAT, mucous membranes moist.   Neck: Supple  Cardiovascular: S1 S2 auscultated, bradycardic, no murmur  Respiratory: Clear to auscultation bilaterally with equal chest rise, no wheezing   Abdomen:  Soft, nontender, nondistended, + bowel sounds  Extremities: warm dry without cyanosis clubbing or edema  Neuro: AAOx3, nonfocal  Psych: Normal affect and demeanor with intact judgement and insight  Data Reviewed: I have personally reviewed following labs and imaging studies  CBC: Recent Labs  Lab 11/06/17 0611 11/08/17 0627 11/09/17 0556 11/10/17 0516 11/11/17 0548  WBC 7.0 22.0* 13.4* 10.4 8.7  HGB 12.2 13.3 11.6* 11.4* 11.2*  HCT 37.4 41.2 36.5 35.0* 34.7*  MCV 96.1 97.2 98.1 97.2 96.7  PLT 216 275 212 212 206   Basic Metabolic Panel: Recent Labs  Lab 11/07/17 0406 11/08/17 0627 11/09/17 0556 11/10/17 0516 11/11/17 0548  NA 140 137 136 137 140  K 3.7 3.9 3.6 3.4* 3.7  CL 111 108 106 106 108  CO2 GLUCOSE 96 125* 113* 88 88  BUN 6 7 5* <5* <5*  CREATININE 0.79 0.75 0.74 0.70 0.63  CALCIUM 8.2* 8.8* 7.9* 8.0* 8.2*   GFR: Estimated Creatinine Clearance: 97.1 mL/min (by C-G formula based on SCr of 0.63 mg/dL). Liver Function Tests: Recent Labs  Lab 11/07/17 0406 11/08/17 0627 11/09/17 0556 11/10/17 0516 11/11/17 0548  AST 80* 39 ALT 155* 119* 75* 61*  47  ALKPHOS 81 87 71 79 66  BILITOT 0.9 0.5 0.8 0.6 0.7  PROT 5.5* 6.4* 5.3* 5.4* 5.2*  ALBUMIN 3.2* 3.6 3.0* 2.9* 2.9*   Recent Labs  Lab 11/05/17 2230 11/08/17 0627 11/09/17 0556  LIPASE 33 175* 47   No results for input(s): AMMONIA in the last 168 hours. Coagulation Profile: No results for input(s): INR, PROTIME in the last 168 hours. Cardiac Enzymes: No results for input(s): CKTOTAL, CKMB, CKMBINDEX, TROPONINI in the last 168 hours. BNP (last 3 results) No results for input(s): PROBNP in the last 8760 hours. HbA1C: No results for input(s): HGBA1C in the last 72 hours. CBG: No results for input(s): GLUCAP in the last 168 hours. Lipid Profile: No results for input(s): CHOL, HDL, LDLCALC, TRIG, CHOLHDL, LDLDIRECT in the last 72 hours. Thyroid Function Tests: No results  for input(s): TSH, T4TOTAL, FREET4, T3FREE, THYROIDAB in the last 72 hours. Anemia Panel: No results for input(s): VITAMINB12, FOLATE, FERRITIN, TIBC, IRON, RETICCTPCT in the last 72 hours. Urine analysis:    Component Value Date/Time   COLORURINE AMBER (A) 11/06/2017 0944   APPEARANCEUR CLEAR 11/06/2017 0944   LABSPEC >1.046 (H) 11/06/2017 0944   PHURINE 5.0 11/06/2017 0944   GLUCOSEU NEGATIVE 11/06/2017 0944   HGBUR NEGATIVE 11/06/2017 0944   BILIRUBINUR SMALL (A) 11/06/2017 0944   KETONESUR 5 (A) 11/06/2017 0944   PROTEINUR NEGATIVE 11/06/2017 0944   UROBILINOGEN 0.2 03/12/2014 1122   NITRITE NEGATIVE 11/06/2017 0944   LEUKOCYTESUR NEGATIVE 11/06/2017 0944   Sepsis Labs: (procalcitonin:4,lacticidven:4)  ) Recent Results (from the past 240 hour(s))  Culture, Urine     Status: None   Collection Time: 11/06/17  9:44 AM  Result Value Ref Range Status   Specimen Description   Final    URINE, CLEAN CATCH Performed at [facility], [address], [place], [place] [address]    Special Requests   Final    NONE Performed at [facility], [address], [place], [place] [address]    Culture   Final    NO GROWTH Performed at [facility], [address] [place], [place] [address]    Report Status 11/07/2017 FINAL  Final  MRSA PCR Screening     Status: None   Collection Time: 11/07/17  4:28 PM  Result Value Ref Range Status   MRSA by PCR NEGATIVE NEGATIVE Final    Comment:        The GeneXpert MRSA Assay (FDA approved for NASAL specimens only), is one component of a comprehensive MRSA colonization surveillance program. It is not intended to diagnose MRSA infection nor to guide or monitor treatment for MRSA infections. Performed at [facility], [address], [place], [place] [address]       Radiology Studies: No results found.   Scheduled Meds: . Chlorhexidine Gluconate Cloth  6 each  Topical Once  . [MAR Hold] fluticasone  1 spray Each Nare Daily  . [MAR Hold] loratadine  10 mg Oral Daily  . [MAR Hold] polyethylene glycol  17 g Oral Daily  . scopolamine  1 patch Transdermal Once   Continuous Infusions: . sodium chloride 125 mL/hr at 11/11/17 0309  . [MAR Hold] ciprofloxacin Stopped (11/11/17 0145)  . ciprofloxacin    . [MAR Hold] famotidine (PEPCID) IV Stopped (11/10/17 2206)  . metroNIDAZOLE    . [MAR Hold] metronidazole Stopped (11/11/17 0415)     LOS: 5 days   Time Spent in minutes  30 minutes  October Peery D.O. on 11/11/2017 at 10:10 AM  Between 7am to 7pm - Pager - [phone]  After 7pm go to [email or web address] - password [email or web address]  And look for the night coverage person covering for me after hours  [facility] Office  [phone]

## 2017-11-11 NOTE — Progress Notes (Signed)
4 Days Post-Op   Subjective/Chief Complaint: No complaints   Objective: Vital signs in last 24 hours: Temp:  [98.3 F (36.8 C)-98.5 F (36.9 C)] 98.4 F (36.9 C) (05/06 0515) Pulse Rate:  [54-57] 57 (05/06 0515) Resp:  [18-19] 19 (05/06 0515) BP: (127-163)/(63-87) 148/83 (05/06 0515) SpO2:  [97 %-100 %] 99 % (05/06 0515) Last BM Date: 11/10/17  Intake/Output from previous day: No intake/output data recorded. Intake/Output this shift: No intake/output data recorded.  General appearance: alert and cooperative Resp: clear to auscultation bilaterally Cardio: regular rate and rhythm GI: soft, minimal tenderness  Lab Results:  Recent Labs    11/10/17 0516 11/11/17 0548  WBC 10.4 8.7  HGB 11.4* 11.2*  HCT 35.0* 34.7*  PLT 212 206   BMET Recent Labs    11/10/17 0516 11/11/17 0548  NA 137 140  K 3.4* 3.7  CL 106 108  CO2 23 24  GLUCOSE 88 88  BUN <5* <5*  CREATININE 0.70 0.63  CALCIUM 8.0* 8.2*   PT/INR No results for input(s): LABPROT, INR in the last 72 hours. ABG No results for input(s): PHART, HCO3 in the last 72 hours.  Invalid input(s): PCO2, PO2  Studies/Results: No results found.  Anti-infectives: Anti-infectives (From admission, onward)   Start     Dose/Rate Route Frequency Ordered Stop   11/11/17 0040  ciprofloxacin (CIPRO) IVPB 400 mg     400 mg 200 mL/hr over 60 Minutes Intravenous On call to O.R. 11/11/17 0040 11/12/17 0559   11/06/17 1200  ciprofloxacin (CIPRO) IVPB 400 mg     400 mg 200 mL/hr over 60 Minutes Intravenous Every 12 hours 11/06/17 0447     11/06/17 0400  metroNIDAZOLE (FLAGYL) IVPB 500 mg     500 mg 100 mL/hr over 60 Minutes Intravenous Every 8 hours 11/06/17 0359     11/06/17 0045  ciprofloxacin (CIPRO) IVPB 400 mg     400 mg 200 mL/hr over 60 Minutes Intravenous  Once 11/06/17 0040 11/06/17 0250      Assessment/Plan: s/p Procedure(s): ENDOSCOPIC RETROGRADE CHOLANGIOPANCREATOGRAPHY (ERCP) (N/A) Plan for lap chole  today. Risks and benefits of the surgery discussed with patient as well as some of the technical aspects and she understands and wishes to proceed.  LOS: 5 days    TOTH III,PAUL S 11/11/2017

## 2017-11-11 NOTE — Anesthesia Postprocedure Evaluation (Signed)
Anesthesia Post Note  Patient: Courtney Clements  Procedure(s) Performed: LAPAROSCOPIC CHOLECYSTECTOMY (N/A Abdomen)     Patient location during evaluation: PACU Anesthesia Type: General Level of consciousness: awake and alert Pain management: pain level controlled Vital Signs Assessment: post-procedure vital signs reviewed and stable Respiratory status: spontaneous breathing, nonlabored ventilation and respiratory function stable Cardiovascular status: blood pressure returned to baseline and stable Postop Assessment: no apparent nausea or vomiting Anesthetic complications: no    Last Vitals:  Vitals:   11/11/17 1536 11/11/17 1627  BP: 131/85 (!) 150/96  Pulse: 66 68  Resp: 16   Temp: 37.3 C   SpO2: 97% 97%    Last Pain:  Vitals:   11/11/17 1620  TempSrc:   PainSc: 8                  Cecile Hearing

## 2017-11-11 NOTE — Anesthesia Preprocedure Evaluation (Signed)
Anesthesia Evaluation  Patient identified by MRN, date of birth, ID band Patient awake    Reviewed: Allergy & Precautions, NPO status , Patient's Chart, lab work & pertinent test results  History of Anesthesia Complications (+) PONV, Family history of anesthesia reaction and history of anesthetic complications  Airway Mallampati: II  TM Distance: >3 FB Neck ROM: Full    Dental  (+) Teeth Intact, Dental Advisory Given   Pulmonary sleep apnea , former smoker,    Pulmonary exam normal breath sounds clear to auscultation       Cardiovascular Exercise Tolerance: Good negative cardio ROS Normal cardiovascular exam Rhythm:Regular Rate:Normal     Neuro/Psych negative neurological ROS  negative psych ROS   GI/Hepatic Neg liver ROS, GERD  ,  Endo/Other  negative endocrine ROSObesity   Renal/GU negative Renal ROS     Musculoskeletal  (+) Arthritis , Ehlers-Danlos disease   Abdominal   Peds  Hematology negative hematology ROS (+) Blood dyscrasia, anemia ,   Anesthesia Other Findings Day of surgery medications reviewed with the patient.  Reproductive/Obstetrics negative OB ROS                             Anesthesia Physical Anesthesia Plan  ASA: II  Anesthesia Plan: General   Post-op Pain Management:    Induction: Intravenous  PONV Risk Score and Plan: 4 or greater and Scopolamine patch - Pre-op, Midazolam, Propofol infusion, Dexamethasone, Ondansetron and Diphenhydramine  Airway Management Planned: Oral ETT  Additional Equipment:   Intra-op Plan:   Post-operative Plan: Extubation in OR  Informed Consent: I have reviewed the patients History and Physical, chart, labs and discussed the procedure including the risks, benefits and alternatives for the proposed anesthesia with the patient or authorized representative who has indicated his/her understanding and acceptance.   Dental  advisory given  Plan Discussed with: CRNA  Anesthesia Plan Comments: (Risks/benefits of general anesthesia discussed with patient including risk of damage to teeth, lips, gum, and tongue, nausea/vomiting, allergic reactions to medications, and the possibility of heart attack, stroke and death.  All patient questions answered.  Patient wishes to proceed.)        Anesthesia Quick Evaluation

## 2017-11-11 NOTE — Discharge Instructions (Signed)

## 2017-11-12 ENCOUNTER — Encounter (HOSPITAL_COMMUNITY): Payer: Self-pay | Admitting: General Surgery

## 2017-11-12 DIAGNOSIS — R52 Pain, unspecified: Secondary | ICD-10-CM

## 2017-11-12 DIAGNOSIS — R933 Abnormal findings on diagnostic imaging of other parts of digestive tract: Secondary | ICD-10-CM

## 2017-11-12 DIAGNOSIS — K8000 Calculus of gallbladder with acute cholecystitis without obstruction: Secondary | ICD-10-CM

## 2017-11-12 DIAGNOSIS — R740 Nonspecific elevation of levels of transaminase and lactic acid dehydrogenase [LDH]: Secondary | ICD-10-CM

## 2017-11-12 DIAGNOSIS — K810 Acute cholecystitis: Secondary | ICD-10-CM

## 2017-11-12 DIAGNOSIS — K8050 Calculus of bile duct without cholangitis or cholecystitis without obstruction: Secondary | ICD-10-CM

## 2017-11-12 LAB — COMPREHENSIVE METABOLIC PANEL
ALBUMIN: 3.3 g/dL — AB (ref 3.5–5.0)
ALT: 43 U/L (ref 14–54)
ANION GAP: 8 (ref 5–15)
AST: 25 U/L (ref 15–41)
Alkaline Phosphatase: 72 U/L (ref 38–126)
BILIRUBIN TOTAL: 0.6 mg/dL (ref 0.3–1.2)
BUN: <5 mg/dL — ABNORMAL LOW (ref 6–20)
CO2: 26 mmol/L (ref 22–32)
Calcium: 9 mg/dL (ref 8.9–10.3)
Chloride: 106 mmol/L (ref 101–111)
Creatinine, Ser: 0.81 mg/dL (ref 0.44–1.00)
Glucose, Bld: 128 mg/dL — ABNORMAL HIGH (ref 65–99)
POTASSIUM: 3.8 mmol/L (ref 3.5–5.1)
Sodium: 140 mmol/L (ref 135–145)
TOTAL PROTEIN: 6 g/dL — AB (ref 6.5–8.1)

## 2017-11-12 LAB — CBC
HCT: 39.6 % (ref 36.0–46.0)
Hemoglobin: 12.9 g/dL (ref 12.0–15.0)
MCH: 31.3 pg (ref 26.0–34.0)
MCHC: 32.6 g/dL (ref 30.0–36.0)
MCV: 96.1 fL (ref 78.0–100.0)
Platelets: 302 10*3/uL (ref 150–400)
RBC: 4.12 MIL/uL (ref 3.87–5.11)
RDW: 12.5 % (ref 11.5–15.5)
WBC: 14.9 10*3/uL — AB (ref 4.0–10.5)

## 2017-11-12 MED ORDER — BUTALBITAL-APAP-CAFFEINE 50-325-40 MG PO TABS: 1.0000 | ORAL_TABLET | Freq: Four times a day (QID) | ORAL | 0 refills | Status: AC | PRN

## 2017-11-12 MED ORDER — OXYCODONE HCL 5 MG PO TABS: 5.0000 mg | ORAL_TABLET | Freq: Four times a day (QID) | ORAL | 0 refills | Status: DC | PRN

## 2017-11-12 MED ORDER — IBUPROFEN 600 MG PO TABS: 600.0000 mg | ORAL_TABLET | Freq: Three times a day (TID) | ORAL | 0 refills | Status: DC | PRN

## 2017-11-12 MED ORDER — ACETAMINOPHEN 325 MG PO TABS: 650.0000 mg | ORAL_TABLET | Freq: Four times a day (QID) | ORAL | Status: AC | PRN

## 2017-11-12 NOTE — Progress Notes (Signed)
[facility] Progress Note  1 Day Post-Op  Subjective: CC: abdominal soreness Abdomen is sore but pain well controlled with oxy and tylenol. No flatus but no nausea with eating. Ambulating plenty. Discussed post-op follow up.   Objective: Vital signs in last 24 hours: Temp:  [97.7 F (36.5 C)-99.1 F (37.3 C)] 98.2 F (36.8 C) (05/07 0448) Pulse Rate:  [52-98] 52 (05/07 0448) Resp:  [12-22] 19 (05/07 0448) BP: (119-177)/(77-112) 119/77 (05/07 0448) SpO2:  [94 %-100 %] 98 % (05/07 0448) Last BM Date: 11/10/17  Intake/Output from previous day: 05/06 0701 - 05/07 0700 In: 800 [I.V.:800] Out: 2500 [Urine:2475; Blood:25] Intake/Output this shift: No intake/output data recorded.  PE: Gen:  Alert, NAD, pleasant Card:  Regular rate and rhythm, pedal pulses 2+ BL Pulm:  Normal effort, clear to auscultation bilaterally Abd: Soft, appropriately tender, non-distended, bowel sounds present, no HSM, incisions C/D/I Skin: warm and dry, no rashes  Psych: A&Ox3   Lab Results:  Recent Labs    11/11/17 0548 11/12/17 0647  WBC 8.7 14.9*  HGB 11.2* 12.9  HCT 34.7* 39.6  PLT 206 302   BMET Recent Labs    11/11/17 0548 11/12/17 0647  NA 140 140  K 3.7 3.8  CL 108 106  CO2 24 26  GLUCOSE 88 128*  BUN <5* <5*  CREATININE 0.63 0.81  CALCIUM 8.2* 9.0   PT/INR No results for input(s): LABPROT, INR in the last 72 hours. CMP     Component Value Date/Time   NA 140 11/12/2017 0647   K 3.8 11/12/2017 0647   CL 106 11/12/2017 0647   CO2 26 11/12/2017 0647   GLUCOSE 128 (H) 11/12/2017 0647   BUN <5 (L) 11/12/2017 0647   CREATININE 0.81 11/12/2017 0647   CALCIUM 9.0 11/12/2017 0647   PROT 6.0 (L) 11/12/2017 0647   ALBUMIN 3.3 (L) 11/12/2017 0647   AST 25 11/12/2017 0647   ALT 43 11/12/2017 0647   ALKPHOS 72 11/12/2017 0647   BILITOT 0.6 11/12/2017 0647   GFRNONAA >60 11/12/2017 0647   GFRAA >60 11/12/2017 0647   Lipase     Component Value Date/Time   LIPASE  47 11/09/2017 0556       Studies/Results: No results found.  Anti-infectives: Anti-infectives (From admission, onward)   Start     Dose/Rate Route Frequency Ordered Stop   11/11/17 1002  metroNIDAZOLE (FLAGYL) 5-0.79 MG/ML-% IVPB    Note to Pharmacy:  Key, Kristopher   : cabinet override      11/11/17 1002 11/11/17 1026   11/11/17 0040  ciprofloxacin (CIPRO) IVPB 400 mg  Status:  Discontinued     400 mg 200 mL/hr over 60 Minutes Intravenous On call to O.R. 11/11/17 0040 11/11/17 1544   11/06/17 1200  ciprofloxacin (CIPRO) IVPB 400 mg     400 mg 200 mL/hr over 60 Minutes Intravenous Every 12 hours 11/06/17 0447 11/12/17 2359   11/06/17 0400  metroNIDAZOLE (FLAGYL) IVPB 500 mg     500 mg 100 mL/hr over 60 Minutes Intravenous Every 8 hours 11/06/17 0359 11/12/17 2359   11/06/17 0045  ciprofloxacin (CIPRO) IVPB 400 mg     400 mg 200 mL/hr over 60 Minutes Intravenous  Once 11/06/17 0040 11/06/17 0250       Assessment/Plan S/P lap chole 11/11/17 - POD#1 - incisions c/d/i - pain well controlled, abdominal exam benign, tolerating diet - no more abx needed from surgery standpoint - ok for discharge from surgery standpoint - Rx and  f/u in chart  LOS: 6 days    Wells Guiles , [facility] 11/12/2017, 10:19 AM Pager: [phone] Consults: [phone] Mon-Fri 7:00 am-4:30 pm Sat-Sun 7:00 am-11:30 am

## 2017-11-12 NOTE — Discharge Summary (Signed)
Physician Discharge Summary  Courtney Clements GEX:[id number] DOB: 1970/07/12 DOA: 11/05/2017  PCP: No primary care provider on file.  Admit date: 11/05/2017 Discharge date: 11/12/2017  Time spent: 45 minutes  Recommendations for Outpatient Follow-up:  Patient will be discharged to home.  Patient will need to follow up with primary care provider within one week of discharge, repeat CBC and discuss possible cardiology follow up for bradycardia.  Follow up with general surgery. Patient should continue medications as prescribed.  Patient should follow a regular diet.   Discharge Diagnoses:  Abdominal pain/RUQ, possible secondary to cholelithiasis/choledocholithiasis ERCP associated pancreatitis Leukocytosis Elevated LFTs Bradycardia Allergic reaction Headache Seasonal allergies  Discharge Condition: stable  Diet recommendation: Regular  Filed Weights   11/05/17 1710 11/07/17 1259 11/11/17 1000  Weight: 89.4 kg (197 lb) 89.4 kg (197 lb) 89.4 kg (197 lb)    History of present illness:  On 11/06/2017 by Dr. Lyda Perone [name] a 47 y.o.femalewith medical history significant ofGERD. Patient presents to the ED with c/o upper abd pain that radiated to back and chest. Occurred around 1AM yesterday, lasted 20 min, resolved. Second episode 1pm yesterday, 20 min resolved. Then reocurred 4pm yesterday and has been constant since that time. Similar episode about 4 months ago, with 2 short episodes of pain, resolved and hasnt reoccurred since then. PSHx of umbilical hernia repair. Has family history of cholecystectomy in her daughter.  Hospital Course:  Abdominal pain/RUQ, possible secondary to cholelithiasis/choledocholithiasis -CT abdomen pelvis showed gallbladder wall thickening versus small amount of pericholecystic fluid. Similar appearance of prominent pancreatic duct and upper normal extrahepatic common bile duct. Soft tissue fullness at the uncinate process of the pancreas,  probably due to adjacent nondistended duodenum. Sigmoid colon diverticular disease without acute inflammation -Right upper quadrant ultrasound showed gallstones with wall thickening.  Borderline to slightly enlarged common bile duct up to 7.5 mm. -Discussed with gastroenterology, Dr. Evette Cristal, recommended MRCP (noninvasive testing first) -MRCP showed cholelithiasis without definite evidence of acute cholecystitis.  Mild diffuse biliary ductal dilatation due to 2 tiny less than 5 mm calculi in the distal common bile duct.  Mild pancreatic ductal dilatation, without evidence of pancreatic divisum or pancreatic mass. -Continue empiric antibiotics with ciprofloxacin and Flagyl, IVF, pain control -Gastroenterology consulted and appreciated, s/p ERCP: Complete removal accomplished by biliary sphincterotomy and balloon extraction.  One plastic stent placed in the ventral pancreatic duct.  Biliary sphincterotomy was performed.  Patient will need repeat x-ray in 1 week to confirm stent has passed.  Recommended Intra-Op cholangiogram to confirm no further CBD stones. -General surgery consulted and appreciated s/p lap chole- 11/11/2017 -Follow up with [facility] -Currently tolerating diet. -Continue pain control PRN -pain improved -continue famotidine  ERCP associated pancreatitis -Lipase rose to 175, down to 47 -resolved  Leukocytosis -Suspect secondary to the above, patient currently afebrile -Denies any urinary symptoms, shortness of breath or cough-doubt infection as patient currently on antibiotics as above -WBC currently 14.9 -Repeat CBC in 1 week  Elevated LFTs -Resolved  Bradycardia -Telemetry monitoring shows no further bradycardia- tele discontinued -however continuous pulse ox showed HR in the 50s with occ drop while sleeping; O2 saturations also dropping during sleep -likely needs outpatient sleep study -currently not any medications that would affect her HR -asymptomatic  -TSH  3.22  Allergic reaction -occurred after MRCP/contrast -given benadryl and resolved  Headache -Resolved- was placed on Fioricet and kpad -possibly secondary to IV medications   Seasonal allergies -Improved, continue Claritin, Flonase, eyedrops  Consultants Gastroenterology,  Dr. Evette Cristal General surgery  Procedures  RUQ [place] MRCP ERCP  Discharge Exam: Vitals:   11/11/17 2058 11/12/17 0448  BP: 123/82 119/77  Pulse: 70 (!) 52  Resp: 19 19  Temp: 98.6 F (37 C) 98.2 F (36.8 C)  SpO2: 98% 98%   States her pain has resolved.  Denies current chest pain, shortness breath, abdominal pain, nausea or vomiting, diarrhea or constipation.  Does states she is having gas.  Has been able to tolerate her diet.   General: Well developed, well nourished, NAD, appears stated age  HEENT: NCAT, mucous membranes moist.  Neck: Supple  Cardiovascular: S1 S2 auscultated, no rubs, murmurs or gallops. Regular rate and rhythm.  Respiratory: Clear to auscultation bilaterally with equal chest rise  Abdomen: Soft,  nontender, nondistended, + bowel sounds, surgical incisions clean  Extremities: warm dry without cyanosis clubbing or edema  Neuro: AAOx3, nonfocal  Psych: Normal affect and demeanor with intact judgement and insight  Discharge Instructions Discharge Instructions    Discharge instructions   Complete by:  As directed    Patient will be discharged to home.  Patient will need to follow up with primary care provider within one week of discharge, repeat CBC and discuss possible cardiology follow up for bradycardia.  Follow up with general surgery. Patient should continue medications as prescribed.  Patient should follow a regular diet.     Allergies as of 11/12/2017      Reactions   Penicillins Anaphylaxis   Has patient had a PCN reaction causing immediate rash, facial/tongue/throat swelling, SOB or lightheadedness with hypotension: Yes Has patient had a PCN reaction causing  severe rash involving mucus membranes or skin necrosis: No Has patient had a PCN reaction that required hospitalization: No Has patient had a PCN reaction occurring within the last 10 years: No If all of the above answers are "NO", then may proceed with Cephalosporin use.   Gadolinium Derivatives Hives, Itching, Swelling   immediatly after contrast injection pt with sneezing and oral swelling. Light chest tightness. Pt was evaluated by Dr Ty Hilts and administered  of benadryl IV.    Adhesive [tape]    Rash /break out   [employer] [iodinated Diagnostic Agents] Swelling   Swelling, hives, itching    Latex Other (See Comments)   irritation   Coconut Oil Rash   Hibiclens [chlorhexidine Gluconate] Rash      Medication List    TAKE these medications   acetaminophen 325 MG tablet Commonly known as:  TYLENOL Take 2 tablets (650 mg total) by mouth every 6 (six) hours as needed for mild pain (or Fever >/= 101).   butalbital-acetaminophen-caffeine 50-325-40 MG tablet Commonly known as:  FIORICET, ESGIC Take 1 tablet by mouth every 6 (six) hours as needed for headache.   doxycycline 100 MG capsule Commonly known as:  VIBRAMYCIN Take 100 mg by mouth daily.   fexofenadine 180 MG tablet Commonly known as:  ALLEGRA Take 180 mg by mouth daily.   fluticasone 50 MCG/ACT nasal spray Commonly known as:  FLONASE Place into both nostrils daily.   ibuprofen 600 MG tablet Commonly known as:  ADVIL,MOTRIN Take 1 tablet (600 mg total) by mouth every 8 (eight) hours as needed for mild pain. What changed:  reasons to take this   oxyCODONE 5 MG immediate release tablet Commonly known as:  Oxy IR/ROXICODONE Take 1 tablet (5 mg total) by mouth every 6 (six) hours as needed for moderate pain or severe pain.   PROAIR HFA  108 (90 Base) MCG/ACT inhaler Generic drug:  albuterol as needed.   pseudoephedrine 30 MG tablet Commonly known as:  SUDAFED Take 30 mg by mouth every 4 (four) hours as  needed for congestion.      Allergies  Allergen Reactions  . Penicillins Anaphylaxis    Has patient had a PCN reaction causing immediate rash, facial/tongue/throat swelling, SOB or lightheadedness with hypotension: Yes Has patient had a PCN reaction causing severe rash involving mucus membranes or skin necrosis: No Has patient had a PCN reaction that required hospitalization: No Has patient had a PCN reaction occurring within the last 10 years: No If all of the above answers are "NO", then may proceed with Cephalosporin use.   . Gadolinium Derivatives Hives, Itching and Swelling    immediatly after contrast injection pt with sneezing and oral swelling. Light chest tightness. Pt was evaluated by Dr Ty Hilts and administered 25mg  of benadryl IV.   . Adhesive [Tape]     Rash /break out  . Contrast Media [Iodinated Diagnostic Agents] Swelling    Swelling, hives, itching   . Latex Other (See Comments)    irritation  . Coconut Oil Rash  . Hibiclens [Chlorhexidine Gluconate] Rash   Follow-up Information    Sur[facility], [facility]. Go on 11/26/2017.   Specialty:  General Surgery Why:  Your post-op follow up appointment is scheduled for 10:00 AM. Please arrive 30 min prior to appointment time. Bring photo ID and insurance information.  Contact information: [address] [place] [address] [phone]        Primary care physician. Schedule an appointment as soon as possible for a visit in 1 week(s).   Why:  Hospital follow-up           The results of significant diagnostics from this hospitalization (including imaging, microbiology, ancillary and laboratory) are listed below for reference.    Significant Diagnostic Studies: Dg Chest 2 View  Result Date: 11/05/2017 CLINICAL DATA:  Middle chest pain EXAM: CHEST - 2 VIEW COMPARISON:  07/05/2011 FINDINGS: The heart size and mediastinal contours are within normal limits. Both lungs are clear. Minimal degenerative  changes of the spine. IMPRESSION: No active cardiopulmonary disease. Electronically Signed   By: Jasmine Pang M.D.   On: 11/05/2017 18:10   Ct Abdomen Pelvis W Contrast  Result Date: 11/06/2017 CLINICAL DATA:  Upper epigastric pain elevated white count EXAM: CT ABDOMEN AND PELVIS WITH CONTRAST TECHNIQUE: Multidetector CT imaging of the abdomen and pelvis was performed using the standard protocol following bolus administration of intravenous contrast. CONTRAST:  ISOVUE-300 IOPAMIDOL (ISOVUE-300) INJECTION 61% COMPARISON:  CT 11/16/2013 FINDINGS: Lower chest: Lung bases demonstrate no acute consolidation or pleural effusion. Heart size within normal limits. Small esophageal hiatal hernia. Hepatobiliary: Focal fat infiltration near the falciform ligament. Small cyst near the gallbladder fossa. Appearance of mild periportal edema. Gallbladder wall thickening versus small amount of pericholecystic fluid. No calcified gallstones. Extrahepatic bile duct upper normal. Pancreas: No inflammatory changes. Similar appearance of proximal pancreatic duct dilatation. Soft tissue fullness at the uncinate process and head of pancreas. Spleen: Normal in size without focal abnormality. Adrenals/Urinary Tract: Adrenal glands are unremarkable. Kidneys are normal, without renal calculi, focal lesion, or hydronephrosis. Bladder is unremarkable. Stomach/Bowel: Stomach is within normal limits. Appendix appears normal. No evidence of bowel wall thickening, distention, or inflammatory changes. 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 No enlarged abdominal or pelvic lymph nodes. Reproductive: Uterus and  bilateral adnexa are unremarkable. Other: Negative for free air or free fluid. Musculoskeletal: No acute or significant osseous findings. IMPRESSION: 1. Gallbladder wall thickening versus small amount of pericholecystic fluid. Suggest correlation  with right upper quadrant abdominal ultrasound. 2. Similar appearance of prominent pancreatic duct and upper normal extrahepatic common bile duct. Soft tissue fullness at the uncinate process of the pancreas, probably due to adjacent nondistended duodenum. 3. Sigmoid colon diverticular disease without acute inflammation Electronically Signed   By: Jasmine Pang M.D.   On: 11/06/2017 00:36   Mr 3d Recon At Scanner  Result Date: 11/06/2017 CLINICAL DATA:  Abdominal pain and nausea. Elevated liver function tests. Cholelithiasis. Mild biliary and pancreatic ductal dilatation on recent ultrasound and CT. EXAM: MRI ABDOMEN WITHOUT AND WITH CONTRAST (INCLUDING MRCP) TECHNIQUE: Multiplanar multisequence MR imaging of the abdomen was performed both before and after the administration of intravenous contrast. Heavily T2-weighted images of the biliary and pancreatic ducts were obtained, and three-dimensional MRCP images were rendered by post processing. CONTRAST:  18mL MULTIHANCE GADOBENATE DIMEGLUMINE 529 MG/ML IV SOLN COMPARISON:  Ultrasound on 11/06/2017 and CT on 11/05/2017 FINDINGS: Lower chest: No acute findings. Hepatobiliary: No hepatic masses identified. Tiny approximately 1 cm cyst is seen in the left lobe adjacent to the gallbladder fossa. Tiny gallstones are noted, without significant gallbladder wall thickening or pericholecystic inflammatory changes. Mild biliary ductal dilatation seen with common bile duct measuring 7 mm. Two tiny less than 5 calculi are seen in the distal common bile duct. Pancreas: No mass or inflammatory changes. Mild pancreatic ductal dilatation is seen in the pancreatic head measuring up to 4 mm. No evidence of pancreas divisum. Spleen:  Within normal limits in size and appearance. Adrenals/Urinary Tract: No masses identified. No evidence of hydronephrosis. Stomach/Bowel: Visualized portion unremarkable. Vascular/Lymphatic: No pathologically enlarged lymph nodes identified. No abdominal  aortic aneurysm. Other:  None. Musculoskeletal:  No suspicious bone lesions identified. IMPRESSION: Cholelithiasis, without definite evidence of acute cholecystitis. Mild diffuse biliary ductal dilatation due to 2 tiny less than 5 mm calculi in the distal common bile duct. Mild pancreatic ductal dilatation, without evidence of pancreas divisum or pancreatic mass. Electronically Signed   By: Myles Rosenthal M.D.   On: 11/06/2017 13:48   Dg Ercp Biliary & Pancreatic Ducts  Result Date: 11/07/2017 CLINICAL DATA:  47 year old female with choledocholithiasis EXAM: ERCP TECHNIQUE: Multiple spot images obtained with the fluoroscopic device and submitted for interpretation post-procedure. FLUOROSCOPY TIME:  Fluoroscopy Time: 8 minutes 46 seconds for a total of 95.7 mGy COMPARISON:  MRCP 11/06/2017 FINDINGS: A total of 7 intraoperative saved images were obtained during ERCP and submitted for review. The images demonstrate a flexible endoscope in the descending duodenum with wire cannulation of the left intrahepatic ducts. Cholangiogram demonstrates a small filling defect in the distal common bile duct. Subsequent images demonstrate sphincterotomy and balloon sweep of the common duct. IMPRESSION: ERCP with sphincterotomy and balloon sweep of the common duct. These images were submitted for radiologic interpretation only. Please see the procedural report for the amount of contrast and the fluoroscopy time utilized. Electronically Signed   By: Malachy Moan M.D.   On: 11/07/2017 14:55   Mr Abdomen Mrcp Vivien Rossetti Contast  Result Date: 11/06/2017 CLINICAL DATA:  Abdominal pain and nausea. Elevated liver function tests. Cholelithiasis. Mild biliary and pancreatic ductal dilatation on recent ultrasound and CT. EXAM: MRI ABDOMEN WITHOUT AND WITH CONTRAST (INCLUDING MRCP) TECHNIQUE: Multiplanar multisequence MR imaging of the abdomen was performed both before and after the  administration of intravenous contrast. Heavily T2-weighted  images of the biliary and pancreatic ducts were obtained, and three-dimensional MRCP images were rendered by post processing. CONTRAST:  18mL MULTIHANCE GADOBENATE DIMEGLUMINE 529 MG/ML IV SOLN COMPARISON:  Ultrasound on 11/06/2017 and CT on 11/05/2017 FINDINGS: Lower chest: No acute findings. Hepatobiliary: No hepatic masses identified. Tiny approximately 1 cm cyst is seen in the left lobe adjacent to the gallbladder fossa. Tiny gallstones are noted, without significant gallbladder wall thickening or pericholecystic inflammatory changes. Mild biliary ductal dilatation seen with common bile duct measuring 7 mm. Two tiny less than 5 calculi are seen in the distal common bile duct. Pancreas: No mass or inflammatory changes. Mild pancreatic ductal dilatation is seen in the pancreatic head measuring up to 4 mm. No evidence of pancreas divisum. Spleen:  Within normal limits in size and appearance. Adrenals/Urinary Tract: No masses identified. No evidence of hydronephrosis. Stomach/Bowel: Visualized portion unremarkable. Vascular/Lymphatic: No pathologically enlarged lymph nodes identified. No abdominal aortic aneurysm. Other:  None. Musculoskeletal:  No suspicious bone lesions identified. IMPRESSION: Cholelithiasis, without definite evidence of acute cholecystitis. Mild diffuse biliary ductal dilatation due to 2 tiny less than 5 mm calculi in the distal common bile duct. Mild pancreatic ductal dilatation, without evidence of pancreas divisum or pancreatic mass. Electronically Signed   By: Myles Rosenthal M.D.   On: 11/06/2017 13:48   US Abdomen Limited Ruq  Result Date: 11/06/2017 CLINICAL DATA:  Abnormal CT with right upper quadrant pain EXAM: ULTRASOUND ABDOMEN LIMITED RIGHT UPPER QUADRANT COMPARISON:  CT 11/05/2017 FINDINGS: Gallbladder: Multiple small stones measuring up to 4.5 mm. Increased wall thickness at 4 mm. Negative sonographic Murphy. Common bile duct: Diameter: Borderline to slightly enlarged measuring up to  7.5 mm. Liver: Small cyst adjacent to the gallbladder measuring 1.2 cm. Liver otherwise normal. Portal vein is patent on color Doppler imaging with normal direction of blood flow towards the liver. IMPRESSION: 1. Gallstones with wall thickening but negative sonographic Eulah Pont, findings are equivocal for cholecystitis. If this remains a clinical concern, further evaluation with nuclear medicine hepatobiliary imaging could be obtained to further evaluate 2. Borderline to slightly enlarged common bile duct up to 7.5 mm. Recommend correlation with LFTs with follow-up MRCP as indicated. Electronically Signed   By: Jasmine Pang M.D.   On: 11/06/2017 01:35    Microbiology: Recent Results (from the past 240 hour(s))  Culture, Urine     Status: None   Collection Time: 11/06/17  9:44 AM  Result Value Ref Range Status   Specimen Description   Final    URINE, CLEAN CATCH Performed at [facility], [address], [place], [place] [address]    Special Requests   Final    NONE Performed at [facility], [address], [place], [place] [address]    Culture   Final    NO GROWTH Performed at [facility], [address] [place], [place] [address]    Report Status 11/07/2017 FINAL  Final  MRSA PCR Screening     Status: None   Collection Time: 11/07/17  4:28 PM  Result Value Ref Range Status   MRSA by PCR NEGATIVE NEGATIVE Final    Comment:        The GeneXpert MRSA Assay (FDA approved for NASAL specimens only), is one component of a comprehensive MRSA colonization surveillance program. It is not intended to diagnose MRSA infection nor to guide or monitor treatment for MRSA infections. Performed at [facility], [address], [place], [place] [address]      Labs: Basic Metabolic Panel: Recent Labs  Lab 11/08/17 [phone] 11/09/17 0556 11/10/17 0516 11/11/17 0548 11/12/17 0647  NA 137 136 137 140 140  K 3.9 3.6 3.4* 3.7 3.8   CL 108 106 106 108 106  CO2 22 22 23 24 26   GLUCOSE 125* 113* 88 88 128*  BUN 7 5* <5* <5* <5*  CREATININE 0.75 0.74 0.70 0.63 0.81  CALCIUM 8.8* 7.9* 8.0* 8.2* 9.0   Liver Function Tests: Recent Labs  Lab 11/08/17 0627 11/09/17 0556 11/10/17 0516 11/11/17 0548 11/12/17 0647  AST 39 25 23 16 25   ALT 119* 75* 61* 47 43  ALKPHOS 87 71 79 66 72  BILITOT 0.5 0.8 0.6 0.7 0.6  PROT 6.4* 5.3* 5.4* 5.2* 6.0*  ALBUMIN 3.6 3.0* 2.9* 2.9* 3.3*   Recent Labs  Lab 11/05/17 2230 11/08/17 0627 11/09/17 0556  LIPASE 33 175* 47   No results for input(s): AMMONIA in the last 168 hours. CBC: Recent Labs  Lab 11/08/17 0627 11/09/17 0556 11/10/17 0516 11/11/17 0548 11/12/17 0647  WBC 22.0* 13.4* 10.4 8.7 14.9*  HGB 13.3 11.6* 11.4* 11.2* 12.9  HCT 41.2 36.5 35.0* 34.7* 39.6  MCV 97.2 98.1 97.2 96.7 96.1  PLT 275 212 212 206 302   Cardiac Enzymes: No results for input(s): CKTOTAL, CKMB, CKMBINDEX, TROPONINI in the last 168 hours. BNP: BNP (last 3 results) No results for input(s): BNP in the last 8760 hours.  ProBNP (last 3 results) No results for input(s): PROBNP in the last 8760 hours.  CBG: No results for input(s): GLUCAP in the last 168 hours.     Signed:  Edsel Petrin  [facility] 11/12/2017, 12:29 PM

## 2017-11-25 ENCOUNTER — Ambulatory Visit
Admission: RE | Admit: 2017-11-25 | Discharge: 2017-11-25 | Disposition: A | Discharge status: Home / Self Care | Payer: Self-pay | Source: Ambulatory Visit | Attending: Family Medicine | Admitting: Family Medicine

## 2017-11-25 ENCOUNTER — Other Ambulatory Visit: Payer: Self-pay | Admitting: Family Medicine

## 2017-11-25 DIAGNOSIS — R740 Nonspecific elevation of levels of transaminase and lactic acid dehydrogenase [LDH]: Principal | ICD-10-CM

## 2017-11-25 DIAGNOSIS — R7402 Elevation of levels of lactic acid dehydrogenase (LDH): Secondary | ICD-10-CM

## 2017-11-25 DIAGNOSIS — R7401 Elevation of levels of liver transaminase levels: Secondary | ICD-10-CM

## 2021-06-15 ENCOUNTER — Other Ambulatory Visit: Payer: Self-pay | Admitting: Endocrinology

## 2021-06-15 DIAGNOSIS — Z1231 Encounter for screening mammogram for malignant neoplasm of breast: Secondary | ICD-10-CM

## 2022-01-13 ENCOUNTER — Encounter (HOSPITAL_BASED_OUTPATIENT_CLINIC_OR_DEPARTMENT_OTHER): Payer: Self-pay

## 2022-01-13 ENCOUNTER — Emergency Department (HOSPITAL_BASED_OUTPATIENT_CLINIC_OR_DEPARTMENT_OTHER): Payer: Self-pay

## 2022-01-13 ENCOUNTER — Emergency Department (HOSPITAL_BASED_OUTPATIENT_CLINIC_OR_DEPARTMENT_OTHER)
Admission: EM | Admit: 2022-01-13 | Discharge: 2022-01-13 | Disposition: A | Discharge status: Home / Self Care | Payer: Self-pay | Attending: Emergency Medicine | Admitting: Emergency Medicine

## 2022-01-13 DIAGNOSIS — Z91040 Latex allergy status: Secondary | ICD-10-CM | POA: Insufficient documentation

## 2022-01-13 DIAGNOSIS — N200 Calculus of kidney: Secondary | ICD-10-CM | POA: Insufficient documentation

## 2022-01-13 LAB — COMPREHENSIVE METABOLIC PANEL
ALT: 28 U/L (ref 0–44)
AST: 21 U/L (ref 15–41)
Albumin: 5 g/dL (ref 3.5–5.0)
Alkaline Phosphatase: 71 U/L (ref 38–126)
Anion gap: 9 (ref 5–15)
BUN: 12 mg/dL (ref 6–20)
CO2: 27 mmol/L (ref 22–32)
Calcium: 9.8 mg/dL (ref 8.9–10.3)
Chloride: 101 mmol/L (ref 98–111)
Creatinine, Ser: 0.77 mg/dL (ref 0.44–1.00)
GFR, Estimated: >60 mL/min (ref >60)
Glucose, Bld: 98 mg/dL (ref 70–99)
Potassium: 4.1 mmol/L (ref 3.5–5.1)
Sodium: 137 mmol/L (ref 135–145)
Total Bilirubin: 0.7 mg/dL (ref 0.3–1.2)
Total Protein: 7.4 g/dL (ref 6.5–8.1)

## 2022-01-13 LAB — CBC WITH DIFFERENTIAL/PLATELET
Abs Immature Granulocytes: 0.03 10*3/uL (ref 0.00–0.07)
Basophils Absolute: 0.1 10*3/uL (ref 0.0–0.1)
Basophils Relative: 1 %
Eosinophils Absolute: 0.2 10*3/uL (ref 0.0–0.5)
Eosinophils Relative: 2 %
HCT: 45.3 % (ref 36.0–46.0)
Hemoglobin: 15.3 g/dL — ABNORMAL HIGH (ref 12.0–15.0)
Immature Granulocytes: 0 %
Lymphocytes Relative: 20 %
Lymphs Abs: 1.7 10*3/uL (ref 0.7–4.0)
MCH: 32 pg (ref 26.0–34.0)
MCHC: 33.8 g/dL (ref 30.0–36.0)
MCV: 94.8 fL (ref 80.0–100.0)
Monocytes Absolute: 0.7 10*3/uL (ref 0.1–1.0)
Monocytes Relative: 8 %
Neutro Abs: 5.7 10*3/uL (ref 1.7–7.7)
Neutrophils Relative %: 69 %
Platelets: 299 10*3/uL (ref 150–400)
RBC: 4.78 MIL/uL (ref 3.87–5.11)
RDW: 11.9 % (ref 11.5–15.5)
WBC: 8.3 10*3/uL (ref 4.0–10.5)
nRBC: 0 % (ref 0.0–0.2)

## 2022-01-13 LAB — URINALYSIS, ROUTINE W REFLEX MICROSCOPIC
Bilirubin Urine: NEGATIVE
Glucose, UA: NEGATIVE mg/dL
Nitrite: NEGATIVE
Protein, ur: 30 mg/dL — AB
RBC / HPF: >50 RBC/hpf — ABNORMAL HIGH (ref 0–5)
Specific Gravity, Urine: <1.005 — ABNORMAL LOW (ref 1.005–1.030)
pH: 5.5 (ref 5.0–8.0)

## 2022-01-13 MED ORDER — ONDANSETRON HCL 4 MG PO TABS: 4.0000 mg | ORAL_TABLET | Freq: Four times a day (QID) | ORAL | 0 refills | Status: AC

## 2022-01-13 MED ORDER — TAMSULOSIN HCL 0.4 MG PO CAPS: 0.4000 mg | ORAL_CAPSULE | Freq: Every day | ORAL | 0 refills | Status: AC

## 2022-01-13 MED ORDER — OXYCODONE HCL 5 MG PO TABS: 5.0000 mg | ORAL_TABLET | Freq: Four times a day (QID) | ORAL | 0 refills | Status: DC | PRN

## 2022-01-13 MED ORDER — SODIUM CHLORIDE 0.9 % IV BOLUS
1000.0000 mL | Freq: Once | INTRAVENOUS | Status: AC
  Administered 2022-01-13: 1000 mL via INTRAVENOUS

## 2022-01-13 NOTE — ED Notes (Signed)
Dc instructions reviewed with patient. Patient voiced understanding. Dc with belongings.  °

## 2022-01-13 NOTE — ED Triage Notes (Signed)
Pt presents with acute onset of hematuria. Woke at 0800 this am went to the bathroom noticed she had blood in her urine. Went to UC and noted to have significant amount of blood in her urine. They sent her here for further evaluation.   Pt also reports suprapubic pain this am and then Left flank pain when PA palpated her abd. Pt denies any burning with urination or hx of kidney stones.   Pt HTN in triage, denies hx, reports normal is 127/80

## 2022-01-13 NOTE — ED Provider Notes (Signed)
[facility] EMERGENCY DEPT Provider Note   CSN: [id number] Arrival date & time: 01/13/22  1010     History  Chief Complaint  Patient presents with   Hematuria    Courtney Clements is a 51 y.o. female.  Here with left flank pain, blood in the urine.  Started overnight.  No history of kidney stones.  Went to an urgent care and found to have blood in her urine.  She is not having much discomfort at this time.  She did drink some alcohol last night.  Otherwise denies any nausea or vomiting or specific pain with urination.  Denies any chest pain, fever, chills.  Former smoker.  Nothing makes it worse or better.  No weakness or numbness.  The history is provided by the patient.       Home Medications Prior to Admission medications   Medication Sig Start Date End Date Taking? Authorizing Provider  ondansetron (ZOFRAN) 4 MG tablet Take 1 tablet (4 mg total) by mouth every 6 (six) hours. 01/13/22  Yes Casin Federici, DO  oxyCODONE (ROXICODONE) 5 MG immediate release tablet Take 1 tablet (5 mg total) by mouth every 6 (six) hours as needed for up to 10 doses for severe pain or breakthrough pain. 01/13/22  Yes Dontrey Snellgrove, DO  tamsulosin (FLOMAX) 0.4 MG CAPS capsule Take 1 capsule (0.4 mg total) by mouth daily for 7 days. 01/13/22 01/20/22 Yes Kareli Hossain, DO  acetaminophen (TYLENOL) 325 MG tablet Take 2 tablets (650 mg total) by mouth every 6 (six) hours as needed for mild pain (or Fever >/= 101). 11/12/17   Juliet Rude, PA-C  butalbital-acetaminophen-caffeine (FIORICET, ESGIC) [phone] MG tablet Take 1 tablet by mouth every 6 (six) hours as needed for headache. 11/12/17   Edsel Petrin, DO  doxycycline (VIBRAMYCIN) 100 MG capsule Take 100 mg by mouth daily. 10/31/17   [provider]  fexofenadine (ALLEGRA) 180 MG tablet Take 180 mg by mouth daily.    [provider]  fluticasone (FLONASE) 50 MCG/ACT nasal spray Place into both nostrils daily.    [provider]  ibuprofen (ADVIL,MOTRIN) 600 MG tablet Take 1 tablet (600 mg total) by mouth every 8 (eight) hours as needed for mild pain. 11/12/17   Juliet Rude, PA-C  PROAIR HFA 108 (90 BASE) MCG/ACT inhaler as needed. 04/20/14   [provider]  pseudoephedrine (SUDAFED) 30 MG tablet Take 30 mg by mouth every 4 (four) hours as needed for congestion.    [provider]      Allergies    Penicillins, Gadolinium derivatives, Adhesive [tape], Contrast media [iodinated contrast media], Latex, Coconut (cocos nucifera), and Hibiclens [chlorhexidine gluconate]    Review of Systems   Review of Systems  Physical Exam Updated Vital Signs BP (!) 182/106   Pulse 71   Temp 98.2 F (36.8 C)   Resp 16   SpO2 100%  Physical Exam Vitals and nursing note reviewed.  Constitutional:      General: She is not in acute distress.    Appearance: She is well-developed. She is not ill-appearing.  HENT:     Head: Normocephalic and atraumatic.     Nose: Nose normal.     Mouth/Throat:     Mouth: Mucous membranes are moist.  Eyes:     Conjunctiva/sclera: Conjunctivae normal.     Pupils: Pupils are equal, round, and reactive to light.  Cardiovascular:     Rate and Rhythm: Normal rate and regular rhythm.  Pulses: Normal pulses.     Heart sounds: No murmur heard. Pulmonary:     Effort: Pulmonary effort is normal. No respiratory distress.     Breath sounds: Normal breath sounds.  Abdominal:     Palpations: Abdomen is soft.     Tenderness: There is no abdominal tenderness.  Musculoskeletal:        General: No swelling.     Cervical back: Normal range of motion and neck supple.  Skin:    General: Skin is warm and dry.     Capillary Refill: Capillary refill takes less than 2 seconds.  Neurological:     General: No focal deficit present.     Mental Status: She is alert.  Psychiatric:        Mood and Affect: Mood normal.     ED Results / Procedures / Treatments    Labs (all labs ordered are listed, but only abnormal results are displayed) Labs Reviewed  URINALYSIS, ROUTINE W REFLEX MICROSCOPIC - Abnormal; Notable for the following components:      Result Value   Color, Urine BROWN (*)    APPearance HAZY (*)    Specific Gravity, Urine <1.005 (*)    Hgb urine dipstick LARGE (*)    Ketones, ur TRACE (*)    Protein, ur 30 (*)    Leukocytes,Ua TRACE (*)    RBC / HPF >50 (*)    Bacteria, UA FEW (*)    All other components within normal limits  CBC WITH DIFFERENTIAL/PLATELET - Abnormal; Notable for the following components:   Hemoglobin 15.3 (*)    All other components within normal limits  URINE CULTURE  COMPREHENSIVE METABOLIC PANEL    EKG None  Radiology CT Renal Stone Study  Result Date: 01/13/2022 CLINICAL DATA:  Flank pain. EXAM: CT ABDOMEN AND PELVIS WITHOUT CONTRAST TECHNIQUE: Multidetector CT imaging of the abdomen and pelvis was performed following the standard protocol without IV contrast. RADIATION DOSE REDUCTION: This exam was performed according to the departmental dose-optimization program which includes automated exposure control, adjustment of the mA and/or kV according to patient size and/or use of iterative reconstruction technique. COMPARISON:  11/05/2017 FINDINGS: Lower chest: Unremarkable. Hepatobiliary: The liver shows diffusely decreased attenuation suggesting fat deposition. No suspicious focal abnormality in the liver on this study without intravenous contrast. Gallbladder surgically absent. No intrahepatic biliary dilation. Common bile duct in the head of the pancreas measures 7 mm diameter. Pancreas: No focal mass lesion. No dilatation of the main duct. No intraparenchymal cyst. No peripancreatic edema. Spleen: No splenomegaly. No focal mass lesion. Adrenals/Urinary Tract: No adrenal nodule or mass. No stones are seen in the right kidney or ureter. There is mild left hydroureteronephrosis. No stones in the left kidney.  Ureteral dilatation extends into the pelvis where a 3 mm left UVJ stone is identified (axial 74/2 and well demonstrated coronal 48/5). No evidence for stones free in the bladder lumen. Stomach/Bowel: Stomach is unremarkable. No gastric wall thickening. No evidence of outlet obstruction. Duodenum is normally positioned as is the ligament of Treitz. No small bowel wall thickening. No small bowel dilatation. The terminal ileum is normal. The appendix is normal. No gross colonic mass. No colonic wall thickening. Diverticuli are seen scattered along the entire length of the colon without CT findings of diverticulitis. Vascular/Lymphatic: No abdominal aortic aneurysm. There is no gastrohepatic or hepatoduodenal ligament lymphadenopathy. No retroperitoneal or mesenteric lymphadenopathy. No pelvic sidewall lymphadenopathy. Reproductive: Unremarkable. Other: No intraperitoneal free fluid. Musculoskeletal: No worrisome lytic or  sclerotic osseous abnormality. IMPRESSION: 1. 3 mm left UVJ stone with mild left hydroureteronephrosis. No other evidence of urinary stone disease. 2. Similar prominence of the common bile duct in this patient status post cholecystectomy. While this may reflect postsurgical change, correlation with liver function test recommended. 3. Colonic diverticulosis without diverticulitis. 4. Hepatic steatosis. Electronically Signed   By: Kennith Center M.D.   On: 01/13/2022 11:33    Procedures Procedures    Medications Ordered in ED Medications  sodium chloride 0.9 % bolus 1,000 mL (1,000 mLs Intravenous New Bag/Given 01/13/22 1125)    ED Course/ Medical Decision Making/ A&P                           Medical Decision Making Amount and/or Complexity of Data Reviewed Labs: ordered. Radiology: ordered.  Risk Prescription drug management.   Devory STARSHA MORNING is here with hematuria.  No significant medical history.  Differential diagnosis is kidney stone versus cystitis versus bladder mass.   Unremarkable vitals.  Had some hematuria overnight.  Has some mild left-sided flank pain but nothing severe.  No current pain now.  Appears to have blood in her urine at urgent care and was sent for further evaluation.  Former smoker.  Will get CBC, CMP, urinalysis, CT scan abdomen pelvis.  CT scan shows 3 mm left-sided kidney stone at the UVJ.  No significant leukocytosis, anemia, electrolyte abnormality per my review and interpretation of labs.  Urinalysis does not appear consistent with infection.  Overall pain is well controlled.  Will prescribe narcotics, Zofran, Flomax and have her follow-up with urology.  She understands return precautions.  This chart was dictated using voice recognition software.  Despite best efforts to proofread,  errors can occur which can change the documentation meaning.         Final Clinical Impression(s) / ED Diagnoses Final diagnoses:  Kidney stone    Rx / DC Orders ED Discharge Orders          Ordered    oxyCODONE (ROXICODONE) 5 MG immediate release tablet  Every 6 hours PRN        01/13/22 1154    tamsulosin (FLOMAX) 0.4 MG CAPS capsule  Daily        01/13/22 1154    ondansetron (ZOFRAN) 4 MG tablet  Every 6 hours        01/13/22 1154              Irvan Tiedt, DO 01/13/22 1156

## 2022-01-13 NOTE — Discharge Instructions (Signed)
Take Tylenol and ibuprofen as needed for pain.  Follow-up with urology.  Return if you develop fever greater than 100.4 or uncontrollable nausea, vomiting, pain.  I have prescribed you narcotic pain medicine as well as nausea medicine.  Flomax is also prescribed to help move the stone.

## 2022-01-13 NOTE — ED Notes (Signed)
Patient transported to CT 

## 2022-01-14 LAB — URINE CULTURE: Culture: 30000 — AB

## 2022-01-15 ENCOUNTER — Telehealth (HOSPITAL_BASED_OUTPATIENT_CLINIC_OR_DEPARTMENT_OTHER): Payer: Self-pay | Admitting: *Deleted

## 2022-01-15 NOTE — Telephone Encounter (Signed)
Post ED Visit - Positive Culture Follow-up: Successful Patient Follow-Up  Culture assessed and recommendations reviewed by:  [x]  , Pharm.D. []  Leia Alf, .D., BCPS AQ-ID []  Celedonio Miyamoto, Pharm.D., BCPS []  [address], Pharm.D., BCPS []  [place], Garvin Fila.D., BCPS, AAHIVP []  , Pharm.D., BCPS, AAHIVP []  Georgina Pillion, PharmD, BCPS []  , PharmD, BCPS []  [place], PharmD, BCPS []  [address], PharmD  Positive urine culture  [x]  Patient discharged without antimicrobial prescription  []  Organism is resistant to prescribed ED discharge antimicrobial []  Patient with positive blood cultures  Changes discussed with ED provider: , MD  New antibiotic prescription No further treatment indicated for low colony count of lactobacillus (likely colonizer) in UCx and no urinary symptoms reported.    Estella Husk 01/15/2022, 12:19 PM

## 2022-01-15 NOTE — Progress Notes (Signed)
ED Antimicrobial Stewardship Positive Culture Follow Up   Courtney Clements is an 51 y.o. female who presented to [facility] on 01/13/2022 with a chief complaint of  Chief Complaint  Patient presents with   Hematuria    Recent Results (from the past 720 hour(s))  Urine Culture     Status: Abnormal   Collection Time: 01/13/22 10:54 AM   Specimen: Urine, Clean Catch  Result Value Ref Range Status   Specimen Description   Final    URINE, CLEAN CATCH Performed at [facility], [address], [place], [place] [address]    Special Requests   Final    NONE Performed at [facility], [address] [place], [place] [address]    Culture (A)  Final    30,000 COLONIES/mL LACTOBACILLUS SPECIES Standardized susceptibility testing for this organism is not available. GROUP A STREP (S.PYOGENES) ISOLATED    Report Status 01/14/2022 FINAL  Final    [x]  Patient discharged originally without antimicrobial agent  New antibiotic prescription: No further treatment indicated for low colony count of lactobacillus (likely colonizer) in UCx and no urinary symptoms reported.  ED Provider: , MD   Courtney Clements 01/15/2022, 9:08 AM Clinical Pharmacist Monday - Friday phone -  [phone] Saturday - Sunday phone - [phone]

## 2022-02-05 ENCOUNTER — Emergency Department (HOSPITAL_BASED_OUTPATIENT_CLINIC_OR_DEPARTMENT_OTHER)
Admission: EM | Admit: 2022-02-05 | Discharge: 2022-02-05 | Disposition: A | Discharge status: Home / Self Care | Payer: Self-pay | Attending: Emergency Medicine | Admitting: Emergency Medicine

## 2022-02-05 ENCOUNTER — Encounter (HOSPITAL_BASED_OUTPATIENT_CLINIC_OR_DEPARTMENT_OTHER): Payer: Self-pay | Admitting: Emergency Medicine

## 2022-02-05 ENCOUNTER — Other Ambulatory Visit: Payer: Self-pay

## 2022-02-05 ENCOUNTER — Emergency Department (HOSPITAL_BASED_OUTPATIENT_CLINIC_OR_DEPARTMENT_OTHER): Payer: Self-pay | Admitting: Radiology

## 2022-02-05 DIAGNOSIS — R1012 Left upper quadrant pain: Secondary | ICD-10-CM | POA: Insufficient documentation

## 2022-02-05 DIAGNOSIS — R110 Nausea: Secondary | ICD-10-CM | POA: Insufficient documentation

## 2022-02-05 DIAGNOSIS — Z91040 Latex allergy status: Secondary | ICD-10-CM | POA: Insufficient documentation

## 2022-02-05 DIAGNOSIS — R1032 Left lower quadrant pain: Secondary | ICD-10-CM | POA: Insufficient documentation

## 2022-02-05 DIAGNOSIS — R6883 Chills (without fever): Secondary | ICD-10-CM | POA: Insufficient documentation

## 2022-02-05 DIAGNOSIS — R109 Unspecified abdominal pain: Secondary | ICD-10-CM

## 2022-02-05 DIAGNOSIS — M25572 Pain in left ankle and joints of left foot: Secondary | ICD-10-CM | POA: Insufficient documentation

## 2022-02-05 DIAGNOSIS — D72829 Elevated white blood cell count, unspecified: Secondary | ICD-10-CM | POA: Insufficient documentation

## 2022-02-05 LAB — CBC
HCT: 42.6 % (ref 36.0–46.0)
Hemoglobin: 14 g/dL (ref 12.0–15.0)
MCH: 31.7 pg (ref 26.0–34.0)
MCHC: 32.9 g/dL (ref 30.0–36.0)
MCV: 96.6 fL (ref 80.0–100.0)
Platelets: 286 10*3/uL (ref 150–400)
RBC: 4.41 MIL/uL (ref 3.87–5.11)
RDW: 12.4 % (ref 11.5–15.5)
WBC: 12.1 10*3/uL — ABNORMAL HIGH (ref 4.0–10.5)
nRBC: 0 % (ref 0.0–0.2)

## 2022-02-05 LAB — COMPREHENSIVE METABOLIC PANEL
ALT: 22 U/L (ref 0–44)
AST: 17 U/L (ref 15–41)
Albumin: 4.5 g/dL (ref 3.5–5.0)
Alkaline Phosphatase: 76 U/L (ref 38–126)
Anion gap: 11 (ref 5–15)
BUN: 14 mg/dL (ref 6–20)
CO2: 25 mmol/L (ref 22–32)
Calcium: 9.8 mg/dL (ref 8.9–10.3)
Chloride: 104 mmol/L (ref 98–111)
Creatinine, Ser: 0.87 mg/dL (ref 0.44–1.00)
GFR, Estimated: >60 mL/min (ref >60)
Glucose, Bld: 112 mg/dL — ABNORMAL HIGH (ref 70–99)
Potassium: 4 mmol/L (ref 3.5–5.1)
Sodium: 140 mmol/L (ref 135–145)
Total Bilirubin: 0.4 mg/dL (ref 0.3–1.2)
Total Protein: 6.7 g/dL (ref 6.5–8.1)

## 2022-02-05 LAB — URINALYSIS, ROUTINE W REFLEX MICROSCOPIC
Bilirubin Urine: NEGATIVE
Glucose, UA: NEGATIVE mg/dL
Hgb urine dipstick: NEGATIVE
Ketones, ur: NEGATIVE mg/dL
Leukocytes,Ua: NEGATIVE
Nitrite: NEGATIVE
Protein, ur: NEGATIVE mg/dL
Specific Gravity, Urine: 1.013 (ref 1.005–1.030)
pH: 5 (ref 5.0–8.0)

## 2022-02-05 NOTE — ED Provider Notes (Signed)
[facility] EMERGENCY DEPT Provider Note   CSN: [id number] Arrival date & time: 02/05/22  1427     History  Chief Complaint  Patient presents with   Flank Pain    Courtney Clements is a 51 y.o. female with PMH of hiatal hernia and recent history of kidney stone discovered on CT beginning of this month presenting today for abdominal pain.  Patient reported that the kidney stone passed 2 weeks ago.  But in the last 2 days she has experienced nausea and chills and pain on the left side of her abdomen and she was concerned that she might have a kidney infection.  The pain is overall mild but concerning per patient with regards to recent passing of a kidney stone.  She is taking ibuprofen intermittently which has alleviated some of her pain.  Denies fever diarrhea but did vomit twice this morning.  Stated that she can vomit intermittently because of her hiatal hernia but that she sees a GI doctor.  Denies any change in her urination including bloody urine or changes in frequency.  Patient also expressed concern about her left ankle stating that it was painful this morning when she woke up from sleep.    Flank Pain      Home Medications Prior to Admission medications   Medication Sig Start Date End Date Taking? Authorizing Provider  acetaminophen (TYLENOL) 325 MG tablet Take 2 tablets (650 mg total) by mouth every 6 (six) hours as needed for mild pain (or Fever >/= 101). 11/12/17   Juliet Rude, PA-C  butalbital-acetaminophen-caffeine (FIORICET, ESGIC) [phone] MG tablet Take 1 tablet by mouth every 6 (six) hours as needed for headache. 11/12/17   Edsel Petrin, DO  doxycycline (VIBRAMYCIN) 100 MG capsule Take 100 mg by mouth daily. 10/31/17   [provider]  fexofenadine (ALLEGRA) 180 MG tablet Take 180 mg by mouth daily.    [provider]  fluticasone (FLONASE) 50 MCG/ACT nasal spray Place into both nostrils daily.    [provider]  ibuprofen  (ADVIL,MOTRIN) 600 MG tablet Take 1 tablet (600 mg total) by mouth every 8 (eight) hours as needed for mild pain. 11/12/17   Juliet Rude, PA-C  ondansetron (ZOFRAN) 4 MG tablet Take 1 tablet (4 mg total) by mouth every 6 (six) hours. 01/13/22   Curatolo, Adam, DO  oxyCODONE (ROXICODONE) 5 MG immediate release tablet Take 1 tablet (5 mg total) by mouth every 6 (six) hours as needed for up to 10 doses for severe pain or breakthrough pain. 01/13/22   Cur[place]olo, Adam, DO  PROAIR HFA 108 (90 BASE) MCG/ACT inhaler as needed. 04/20/14   [provider]  pseudoephedrine (SUDAFED) 30 MG tablet Take 30 mg by mouth every 4 (four) hours as needed for congestion.    [provider]      Allergies    Penicillins, Gadolinium derivatives, Adhesive [tape], Contrast media [iodinated contrast media], Latex, Coconut (cocos nucifera), and Hibiclens [chlorhexidine gluconate]    Review of Systems   Review of Systems  Genitourinary:  Positive for flank pain.    Physical Exam Updated Vital Signs BP (!) 141/96 (BP Location: Right Arm)   Pulse 73   Temp 99.3 F (37.4 C)   Resp 17   LMP 11/01/2017 Comment: negative beta HCG 11/05/17  SpO2 100%  Physical Exam Vitals and nursing note reviewed.  HENT:     Head: Normocephalic and atraumatic.     Mouth/Throat:     Mouth: Mucous  membranes are moist.  Eyes:     General:        Right eye: No discharge.        Left eye: No discharge.     Conjunctiva/sclera: Conjunctivae normal.  Cardiovascular:     Rate and Rhythm: Normal rate and regular rhythm.     Pulses: Normal pulses.     Heart sounds: Normal heart sounds.  Pulmonary:     Effort: Pulmonary effort is normal.     Breath sounds: Normal breath sounds.  Abdominal:     General: Abdomen is flat.     Palpations: Abdomen is soft.     Tenderness: There is abdominal tenderness in the left upper quadrant and left lower quadrant. There is left CVA tenderness (mild).  Skin:    General: Skin is warm  and dry.  Neurological:     General: No focal deficit present.  Psychiatric:        Mood and Affect: Mood normal.     ED Results / Procedures / Treatments   Labs (all labs ordered are listed, but only abnormal results are displayed) Labs Reviewed  URINALYSIS, ROUTINE W REFLEX MICROSCOPIC - Abnormal; Notable for the following components:      Result Value   Color, Urine COLORLESS (*)    All other components within normal limits  CBC - Abnormal; Notable for the following components:   WBC 12.1 (*)    All other components within normal limits  COMPREHENSIVE METABOLIC PANEL - Abnormal; Notable for the following components:   Glucose, Bld 112 (*)    All other components within normal limits    Radiology DG Ankle Complete Left  Result Date: 02/05/2022 CLINICAL DATA:  Left ankle swelling and pain since Saturday. No known injury. EXAM: LEFT ANKLE COMPLETE - 3+ VIEW COMPARISON:  MRI of the left ankle from 02/05/2009 FINDINGS: Mild diffuse soft tissue swelling. No signs of acute fracture or dislocation. There is an os ossific density adjacent to the tip of the lateral malleolus unchanged in appearance from previous exam. This is compatible with previously documented remote avulsion type fracture involving the lateral malleolus. IMPRESSION: 1. No acute bone abnormality. 2. Soft tissue swelling. Electronically Signed   By: Signa Kell M.D.   On: 02/05/2022 15:33       ED Course/ Medical Decision Making/ A&P  Medical Decision Making Patient presented today with abdominal pain and flank tenderness in the setting of recent history of kidney stone.  Patient was primarily concerned with subsequent kidney infection after the passing of that stone about 2 weeks ago. Differential diagnosis for this concern includes nephrolithiasis, renal infection, and diverticulitis. Considered renal pathology but urinalysis was unremarkable, denies hematuria, and CVA tenderness was mild at worst on exam. I  interpreted and reviewed the labs which revealed mild leukocytosis. I discussed with patient that clinically overall she looked well and did not warrant further evaluation, primarily a CT scan. I offered fluids and pain management for symptomatic relief but patient deferred and  stated  that she wanted "to make sure there was no infection". We also discussed that infection cannot fully be ruled out and advised her to return if infectious signs occurred.            Final Clinical Impression(s) / ED Diagnoses Final diagnoses:  Abdominal pain, unspecified abdominal location    Rx / DC Orders ED Discharge Orders     None         Gareth Eagle, PA-C  02/05/22 1832    Alvira Monday, MD 02/07/22 2319

## 2022-02-05 NOTE — Discharge Instructions (Addendum)
Diagnosed with abdominal pain.  Labs including urinalysis and vitals are reassuring that infection is likely not occurring at this time.  Nevertheless if abdominal pain worsens, new fever, or new bloody urine or other concerning urinary changes, please return to the emergency department for further evaluation.  Otherwise, recommend follow-up with your GI doctor and PCP.

## 2022-02-05 NOTE — ED Triage Notes (Signed)
Pt here from home with c/o  left flank pain/ back pain , had a kidney stones 2 weeks was seen here ,

## 2024-01-23 ENCOUNTER — Emergency Department (HOSPITAL_BASED_OUTPATIENT_CLINIC_OR_DEPARTMENT_OTHER)

## 2024-01-23 ENCOUNTER — Other Ambulatory Visit: Payer: Self-pay

## 2024-01-23 ENCOUNTER — Emergency Department (HOSPITAL_BASED_OUTPATIENT_CLINIC_OR_DEPARTMENT_OTHER)
Admission: EM | Admit: 2024-01-23 | Discharge: 2024-01-23 | Disposition: A | Discharge status: Home / Self Care | Attending: Emergency Medicine | Admitting: Emergency Medicine

## 2024-01-23 ENCOUNTER — Encounter (HOSPITAL_BASED_OUTPATIENT_CLINIC_OR_DEPARTMENT_OTHER): Payer: Self-pay

## 2024-01-23 DIAGNOSIS — N131 Hydronephrosis with ureteral stricture, not elsewhere classified: Secondary | ICD-10-CM | POA: Diagnosis not present

## 2024-01-23 DIAGNOSIS — Z91040 Latex allergy status: Secondary | ICD-10-CM | POA: Diagnosis not present

## 2024-01-23 DIAGNOSIS — N23 Unspecified renal colic: Secondary | ICD-10-CM | POA: Insufficient documentation

## 2024-01-23 DIAGNOSIS — R1031 Right lower quadrant pain: Secondary | ICD-10-CM | POA: Diagnosis present

## 2024-01-23 LAB — COMPREHENSIVE METABOLIC PANEL WITH GFR
ALT: 95 U/L — ABNORMAL HIGH (ref 0–44)
AST: 50 U/L — ABNORMAL HIGH (ref 15–41)
Albumin: 4.4 g/dL (ref 3.5–5.0)
Alkaline Phosphatase: 107 U/L (ref 38–126)
Anion gap: 13 (ref 5–15)
BUN: 12 mg/dL (ref 6–20)
CO2: 21 mmol/L — ABNORMAL LOW (ref 22–32)
Calcium: 9.5 mg/dL (ref 8.9–10.3)
Chloride: 105 mmol/L (ref 98–111)
Creatinine, Ser: 0.83 mg/dL (ref 0.44–1.00)
GFR, Estimated: >60 mL/min (ref >60)
Glucose, Bld: 155 mg/dL — ABNORMAL HIGH (ref 70–99)
Potassium: 3.8 mmol/L (ref 3.5–5.1)
Sodium: 139 mmol/L (ref 135–145)
Total Bilirubin: 0.4 mg/dL (ref 0.0–1.2)
Total Protein: 6.8 g/dL (ref 6.5–8.1)

## 2024-01-23 LAB — CBC
HCT: 42.8 % (ref 36.0–46.0)
Hemoglobin: 14.3 g/dL (ref 12.0–15.0)
MCH: 31.8 pg (ref 26.0–34.0)
MCHC: 33.4 g/dL (ref 30.0–36.0)
MCV: 95.1 fL (ref 80.0–100.0)
Platelets: 256 K/uL (ref 150–400)
RBC: 4.5 MIL/uL (ref 3.87–5.11)
RDW: 12.4 % (ref 11.5–15.5)
WBC: 7.9 K/uL (ref 4.0–10.5)
nRBC: 0 % (ref 0.0–0.2)

## 2024-01-23 LAB — URINALYSIS, ROUTINE W REFLEX MICROSCOPIC
Bilirubin Urine: NEGATIVE
Glucose, UA: NEGATIVE mg/dL
Ketones, ur: NEGATIVE mg/dL
Nitrite: NEGATIVE
Specific Gravity, Urine: 1.032 — ABNORMAL HIGH (ref 1.005–1.030)
pH: 5.5 (ref 5.0–8.0)

## 2024-01-23 LAB — LIPASE, BLOOD: Lipase: 51 U/L (ref 11–51)

## 2024-01-23 MED ORDER — HYDROMORPHONE HCL 1 MG/ML IJ SOLN
1.0000 mg | Freq: Once | INTRAMUSCULAR | Status: AC
  Administered 2024-01-23: 1 mg via INTRAVENOUS
  Filled 2024-01-23: qty 1

## 2024-01-23 MED ORDER — TAMSULOSIN HCL 0.4 MG PO CAPS: 0.4000 mg | ORAL_CAPSULE | Freq: Every day | ORAL | 0 refills | Status: AC

## 2024-01-23 MED ORDER — KETOROLAC TROMETHAMINE 15 MG/ML IJ SOLN
15.0000 mg | Freq: Once | INTRAMUSCULAR | Status: AC
  Administered 2024-01-23: 15 mg via INTRAVENOUS
  Filled 2024-01-23: qty 1

## 2024-01-23 MED ORDER — OXYCODONE HCL 5 MG PO TABS: 2.5000 mg | ORAL_TABLET | Freq: Four times a day (QID) | ORAL | 0 refills | Status: AC | PRN

## 2024-01-23 MED ORDER — KETOROLAC TROMETHAMINE 10 MG PO TABS: 10.0000 mg | ORAL_TABLET | Freq: Four times a day (QID) | ORAL | 0 refills | Status: AC | PRN

## 2024-01-23 MED ORDER — TAMSULOSIN HCL 0.4 MG PO CAPS
0.4000 mg | ORAL_CAPSULE | Freq: Once | ORAL | Status: AC
Start: 2024-01-23 — End: 2024-01-23
  Administered 2024-01-23: 0.4 mg via ORAL
  Filled 2024-01-23: qty 1

## 2024-01-23 MED ORDER — ONDANSETRON HCL 4 MG/2ML IJ SOLN
4.0000 mg | Freq: Once | INTRAMUSCULAR | Status: AC | PRN
  Administered 2024-01-23: 4 mg via INTRAVENOUS
  Filled 2024-01-23: qty 2

## 2024-01-23 MED ORDER — ONDANSETRON 4 MG PO TBDP: 4.0000 mg | ORAL_TABLET | Freq: Three times a day (TID) | ORAL | 0 refills | Status: AC | PRN

## 2024-01-23 MED ORDER — HYDROMORPHONE HCL 1 MG/ML IJ SOLN
0.5000 mg | INTRAMUSCULAR | Status: DC | PRN
  Administered 2024-01-23: 0.5 mg via INTRAVENOUS
  Filled 2024-01-23: qty 1

## 2024-01-23 NOTE — ED Provider Notes (Signed)
 [place] [facility] AT [facility] Provider Note  CSN: [id number] Arrival date & time: 01/23/24 9561  Chief Complaint(s) Abdominal Pain  HPI Courtney Clements is a 53 y.o. female with a past medical history listed below who presents to the emergency department with sudden onset right-sided lower abdominal pain with rapidly worsening intensity and fluctuating in nature.  Associated with nausea and nonbloody nonbilious emesis.  No urinary symptoms.  No diarrhea.    HPI  Past Medical History Past Medical History:  Diagnosis Date   Arthritis    Chronic constipation    taking Metamucil as needed   Diarrhea    Ehlers-Danlos disease    Family history of anesthesia complication    pts mom has nausea and b/p drops   GERD (gastroesophageal reflux disease)    occasionally but doesn't take anything   Hemorrhoids    History of bronchitis    couple of yrs ago   History of migraine    last one at least 4-27months ago   Joint pain    Pneumonia 2002   PONV (postoperative nausea and vomiting)    b/p drops low and nausea   Seasonal allergies    takes Allegra daily and uses Flonase     Umbilical hernia    Patient Active Problem List   Diagnosis Date Noted   Common bile duct stone    Pain    Acute cholecystitis 11/06/2017   Transaminitis 11/06/2017   Calculus of gallbladder with acute cholecystitis without obstruction 11/06/2017   Abnormal magnetic resonance cholangiopancreatography (MRCP)    DUB (dysfunctional uterine bleeding) 03/26/2014   Home Medication(s) Prior to Admission medications   Medication Sig Start Date End Date Taking? Authorizing Provider  ketorolac  (TORADOL ) 10 MG tablet Take 1 tablet (10 mg total) by mouth every 6 (six) hours as needed for moderate pain (pain score 4-6). 01/23/24  Yes Abria Vannostrand, Raynell Moder, MD  ondansetron  (ZOFRAN -ODT) 4 MG disintegrating tablet Take 1 tablet (4 mg total) by mouth every 8 (eight) hours as needed for up to 3 days for  nausea or vomiting. 01/23/24 01/26/24 Yes Tallen Schnorr, Raynell Moder, MD  oxyCODONE  (ROXICODONE ) 5 MG immediate release tablet Take 0.5-1 tablets (2.5-5 mg total) by mouth every 6 (six) hours as needed for up to 5 days for severe pain (pain score 7-10). 01/23/24 01/28/24 Yes Fredrik Mogel, Raynell Moder, MD  tamsulosin  (FLOMAX ) 0.4 MG CAPS capsule Take 1 capsule (0.4 mg total) by mouth daily for 5 days. 01/23/24 01/28/24 Yes Isabel Ardila, Raynell Moder, MD  acetaminophen  (TYLENOL ) 325 MG tablet Take 2 tablets (650 mg total) by mouth every 6 (six) hours as needed for mild pain (or Fever >/= 101). 11/12/17   Vicci Burnard SAUNDERS, PA-C  butalbital -acetaminophen -caffeine  (FIORICET , ESGIC ) 50-325-40 MG tablet Take 1 tablet by mouth every 6 (six) hours as needed for headache. 11/12/17   Mikhail, Maryann, DO  doxycycline (VIBRAMYCIN) 100 MG capsule Take 100 mg by mouth daily. 10/31/17   [provider]  fexofenadine (ALLEGRA) 180 MG tablet Take 180 mg by mouth daily.    [provider]  fluticasone  (FLONASE ) 50 MCG/ACT nasal spray Place into both nostrils daily.    [provider]  ondansetron  (ZOFRAN ) 4 MG tablet Take 1 tablet (4 mg total) by mouth every 6 (six) hours. 01/13/22   Curatolo, Adam, DO  PROAIR  HFA 108 (90 BASE) MCG/ACT inhaler as needed. 04/20/14   [provider]  pseudoephedrine (SUDAFED) 30 MG tablet Take 30 mg by mouth every 4 (four) hours  as needed for congestion.    [provider]                                                                                                                                    Allergies Penicillins, Gadolinium derivatives, Adhesive [tape], Contrast media [iodinated contrast media], Latex, Coconut (cocos nucifera), and Hibiclens  [chlorhexidine  gluconate]  Review of Systems Review of Systems As noted in HPI  Physical Exam Vital Signs  I have reviewed the triage vital signs BP (!) 168/90   Pulse 68   Temp 98.7 F (37.1 C) (Oral)    Resp 20   Ht 5' 5 (1.651 m)   Wt 89.4 kg   LMP 11/01/2017 Comment: negative beta HCG 11/05/17  SpO2 98%   BMI 32.80 kg/m   Physical Exam Vitals reviewed.  Constitutional:      General: She is not in acute distress.    Appearance: She is well-developed. She is obese. She is not diaphoretic.  HENT:     Head: Normocephalic and atraumatic.     Right Ear: External ear normal.     Left Ear: External ear normal.     Nose: Nose normal.  Eyes:     General: No scleral icterus.    Conjunctiva/sclera: Conjunctivae normal.  Neck:     Trachea: Phonation normal.  Cardiovascular:     Rate and Rhythm: Normal rate and regular rhythm.  Pulmonary:     Effort: Pulmonary effort is normal. No respiratory distress.     Breath sounds: No stridor.  Abdominal:     General: There is no distension.     Tenderness: There is right CVA tenderness.  Musculoskeletal:        General: Normal range of motion.     Cervical back: Normal range of motion.  Neurological:     Mental Status: She is alert and oriented to person, place, and time.  Psychiatric:        Behavior: Behavior normal.     ED Results and Treatments Labs (all labs ordered are listed, but only abnormal results are displayed) Labs Reviewed  COMPREHENSIVE METABOLIC PANEL WITH GFR - Abnormal; Notable for the following components:      Result Value   CO2 21 (*)    Glucose, Bld 155 (*)    AST 50 (*)    ALT 95 (*)    All other components within normal limits  URINALYSIS, ROUTINE W REFLEX MICROSCOPIC - Abnormal; Notable for the following components:   APPearance CLOUDY (*)    Specific Gravity, Urine 1.032 (*)    Hgb urine dipstick TRACE (*)    Protein, ur TRACE (*)    Leukocytes,Ua TRACE (*)    Bacteria, UA RARE (*)    Crystals PRESENT (*)    All other components within normal limits  LIPASE, BLOOD  CBC  EKG  EKG  Interpretation Date/Time:    Ventricular Rate:    PR Interval:    QRS Duration:    QT Interval:    QTC Calculation:   R Axis:      Text Interpretation:         Radiology CT Renal Stone Study Result Date: 01/23/2024 CLINICAL DATA:  53 year old female with abdominal pain and nausea. Bright red blood per rectum. Right lower quadrant pain. EXAM: CT ABDOMEN AND PELVIS WITHOUT CONTRAST TECHNIQUE: Multidetector CT imaging of the abdomen and pelvis was performed following the standard protocol without IV contrast. RADIATION DOSE REDUCTION: This exam was performed according to the departmental dose-optimization program which includes automated exposure control, adjustment of the mA and/or kV according to patient size and/or use of iterative reconstruction technique. COMPARISON:  Noncontrast CT Abdomen and Pelvis 01/13/2022. FINDINGS: Lower chest: Normal. Hepatobiliary: Chronic cholecystectomy. Trace pneumobilia now. Pronounced hepatic steatosis. Pancreas: Negative. Spleen: Negative. Adrenals/Urinary Tract: Normal adrenal glands. Punctate left nephrolithiasis. No left hydronephrosis and diminutive left ureter. Contralateral new right hydronephrosis and hydroureter compared to the 2023 CT. Mild edema/inflammation at the right renal sinus. No intrarenal calculus on that side. Right hydroureter continues into the pelvis, and to the ureterovesical junction. No obstructing calculus is identified. There are small pelvic phleboliths. The bladder is decompressed. No convincing stone within the bladder. Stomach/Bowel: Decompressed large bowel throughout most of the abdomen and pelvis. Diverticulosis beginning in the transverse colon, mild to moderate, and continuing in the descending and sigmoid colon (moderate). No active inflammation is identified. Diminutive and normal appendix on coronal image 69. Decompressed terminal ileum. Nondilated small bowel. Previous ventral abdominal hernia repair appears stable with no  adverse features. Decompressed stomach and duodenum. No pneumoperitoneum or free fluid. Vascular/Lymphatic: Normal caliber abdominal aorta. Vascular patency is not evaluated in the absence of IV contrast. No lymphadenopathy identified. Reproductive: Negative noncontrast appearance. Other: No pelvis free fluid. Musculoskeletal: Lumbosacral junction facet degeneration and chronic vacuum facet. No acute osseous abnormality identified. IMPRESSION: 1. Right hydronephrosis and hydroureter are new from 2023. But no right side nephrolithiasis or obstructing calculus is identified. Consider a recently passed stone. Alternatively consider an ascending UTI. 2. Punctate left nephrolithiasis. Chronic cholecystectomy, with trace pneumobilia now. Pronounced Hepatic Steatosis. Colonic diverticulosis. Normal appendix. Electronically Signed   By: VEAR Hurst M.D.   On: 01/23/2024 06:49    Medications Ordered in ED Medications  HYDROmorphone  (DILAUDID ) injection 0.5 mg (0.5 mg Intravenous Given 01/23/24 0503)  tamsulosin  (FLOMAX ) capsule 0.4 mg (has no administration in time range)  ondansetron  (ZOFRAN ) injection 4 mg (4 mg Intravenous Given 01/23/24 0500)  HYDROmorphone  (DILAUDID ) injection 1 mg (1 mg Intravenous Given 01/23/24 0549)  ketorolac  (TORADOL ) 15 MG/ML injection 15 mg (15 mg Intravenous Given 01/23/24 0703)   Procedures Procedures  (including critical care time) Medical Decision Making / ED Course   Medical Decision Making Amount and/or Complexity of Data Reviewed Labs: ordered. Decision-making details documented in ED Course. Radiology: ordered and independent interpretation performed. Decision-making details documented in ED Course.  Risk Prescription drug management. Parenteral controlled substances.    Right-sided abdominal/flank pain differential diagnosis considered  Workup was most consistent with renal colic from likely radiolucent stone on the right.  UA is not overtly concerning for  infection.  Crystals are present but are not specified.  No bowel obstruction.  No evidence of intra-abdominal inflammatory/infectious process.  Pain controlled with IV maintenance and mostly Toradol .  Appropriate for outpatient management.  Urology follow-up recommended for stone analysis.  Final Clinical Impression(s) / ED Diagnoses Final diagnoses:  Renal colic on right side  Hydronephrosis due to obstruction of ureter   The patient appears reasonably screened and/or stabilized for discharge and I doubt any other medical condition or other [facility] requiring further screening, evaluation, or treatment in the ED at this time. I have discussed the findings, Dx and Tx plan with the patient/family who expressed understanding and agree(s) with the plan. Discharge instructions discussed at length. The patient/family was given strict return precautions who verbalized understanding of the instructions. No further questions at time of discharge.  Disposition: Discharge  Condition: Good  ED Discharge Orders          Ordered    oxyCODONE  (ROXICODONE ) 5 MG immediate release tablet  Every 6 hours PRN        01/23/24 0746    ketorolac  (TORADOL ) 10 MG tablet  Every 6 hours PRN        01/23/24 0746    ondansetron  (ZOFRAN -ODT) 4 MG disintegrating tablet  Every 8 hours PRN        01/23/24 0746    tamsulosin  (FLOMAX ) 0.4 MG CAPS capsule  Daily        01/23/24 0746            [place]  narcotic database reviewed and no active prescriptions noted.   Follow Up: [facility] [address] [place] [address] [place]  [address] [phone] Call       This chart was dictated using voice recognition software.  Despite best efforts to proofread,  errors can occur which can change the documentation meaning.    Trine Raynell Moder, MD 01/23/24 [phone]

## 2024-01-23 NOTE — ED Triage Notes (Signed)
 Pt POV d/t RLQ ABD pain with nausea and bright red blood with clots in her Bowel movement .  She describes it as a lot
